# Patient Record
Sex: Male | Born: 1955 | Race: White | Hispanic: No | Marital: Married | Smoking: Never smoker
Health system: Southern US, Community
[De-identification: ages and names within clinical notes are randomized; demographics above are authoritative.]

## PROBLEM LIST (undated history)

## (undated) DIAGNOSIS — E11621 Type 2 diabetes mellitus with foot ulcer: Secondary | ICD-10-CM

## (undated) DIAGNOSIS — I7300 Raynaud's syndrome without gangrene: Secondary | ICD-10-CM

## (undated) DIAGNOSIS — I341 Nonrheumatic mitral (valve) prolapse: Secondary | ICD-10-CM

## (undated) DIAGNOSIS — E1165 Type 2 diabetes mellitus with hyperglycemia: Secondary | ICD-10-CM

## (undated) DIAGNOSIS — K2270 Barrett's esophagus without dysplasia: Secondary | ICD-10-CM

## (undated) DIAGNOSIS — K219 Gastro-esophageal reflux disease without esophagitis: Secondary | ICD-10-CM

## (undated) DIAGNOSIS — E119 Type 2 diabetes mellitus without complications: Secondary | ICD-10-CM

## (undated) DIAGNOSIS — M109 Gout, unspecified: Secondary | ICD-10-CM

## (undated) DIAGNOSIS — I10 Essential (primary) hypertension: Secondary | ICD-10-CM

## (undated) DIAGNOSIS — N1832 Chronic kidney disease, stage 3b: Secondary | ICD-10-CM

## (undated) DIAGNOSIS — Z794 Long term (current) use of insulin: Secondary | ICD-10-CM

## (undated) HISTORY — PX: NO PAST SURGERIES: SHX2092

## (undated) HISTORY — PX: OTHER SURGICAL HISTORY: SHX169

---

## 2010-09-09 DEATH — deceased

## 2013-02-16 ENCOUNTER — Encounter: Payer: Self-pay | Admitting: Internal Medicine

## 2013-03-02 ENCOUNTER — Ambulatory Visit (INDEPENDENT_AMBULATORY_CARE_PROVIDER_SITE_OTHER): Payer: Managed Care, Other (non HMO) | Admitting: Internal Medicine

## 2013-03-02 DIAGNOSIS — Z7189 Other specified counseling: Secondary | ICD-10-CM

## 2013-03-02 DIAGNOSIS — Z23 Encounter for immunization: Secondary | ICD-10-CM

## 2013-03-02 DIAGNOSIS — Z7184 Encounter for health counseling related to travel: Secondary | ICD-10-CM | POA: Insufficient documentation

## 2013-03-02 NOTE — Progress Notes (Signed)
  Subjective:    [name] [name] is a 58 y.o. male who presents to the Infectious Disease clinic for travel consultation. Planned departure date: July 16th          Planned return date: July 31st Countries of travel: [place] Areas in country: travel on a guided tour   Accommodations: hotel Purpose of travel: vacation Prior travel out of [place]: yes Currently ill / Fever: no History of liver or kidney disease: no  Data Review:  Janumet, Nexium, Propecia; has diabetes 2   Review of Systems n/a    Objective:    n/a    Assessment:    No contraindications to travel. none      Plan:    Issues discussed: environmental concerns, freshwater swimming, future shots, insect-borne illnesses, malaria, MVA safety, rabies, safe food/water, traveler's diarrhea, website/handouts for more information, what to do if ill upon return, what to do if ill while there and Yellow Fever. Immunizations recommended: Hepatitis A series, Typhoid (parenteral) and Yellow Fever. Malaria prophylaxis: malarone, daily dose starting 1-2 days before entering endemic area, ending 7 days after leaving area Traveler's diarrhea prophylaxis: ciprofloxacin.

## 2013-08-16 ENCOUNTER — Ambulatory Visit: Payer: Managed Care, Other (non HMO)

## 2013-11-30 ENCOUNTER — Ambulatory Visit (HOSPITAL_COMMUNITY)
Admission: RE | Admit: 2013-11-30 | Discharge: 2013-11-30 | Disposition: A | Discharge status: Home / Self Care | Payer: Managed Care, Other (non HMO) | Source: Ambulatory Visit | Attending: Cardiovascular Disease | Admitting: Cardiovascular Disease

## 2013-11-30 ENCOUNTER — Encounter (HOSPITAL_COMMUNITY): Payer: Managed Care, Other (non HMO)

## 2013-11-30 ENCOUNTER — Other Ambulatory Visit (HOSPITAL_COMMUNITY): Payer: Self-pay | Admitting: *Deleted

## 2013-11-30 DIAGNOSIS — M7989 Other specified soft tissue disorders: Secondary | ICD-10-CM | POA: Diagnosis not present

## 2013-11-30 DIAGNOSIS — S83511A Sprain of anterior cruciate ligament of right knee, initial encounter: Secondary | ICD-10-CM | POA: Insufficient documentation

## 2013-11-30 DIAGNOSIS — X58XXXA Exposure to other specified factors, initial encounter: Secondary | ICD-10-CM | POA: Diagnosis not present

## 2013-11-30 DIAGNOSIS — S86111A Strain of other muscle(s) and tendon(s) of posterior muscle group at lower leg level, right leg, initial encounter: Secondary | ICD-10-CM | POA: Insufficient documentation

## 2013-11-30 DIAGNOSIS — S86111D Strain of other muscle(s) and tendon(s) of posterior muscle group at lower leg level, right leg, subsequent encounter: Secondary | ICD-10-CM

## 2013-11-30 NOTE — Progress Notes (Signed)
Right Lower Ext. Venous Duplex Completed. Negative for DVT or SVT. Janaa Acero, BS, RDMS, RVT  

## 2013-12-03 ENCOUNTER — Telehealth (HOSPITAL_COMMUNITY): Payer: Self-pay | Admitting: *Deleted

## 2018-05-12 ENCOUNTER — Other Ambulatory Visit: Payer: Self-pay | Admitting: Internal Medicine

## 2018-05-12 DIAGNOSIS — Z0000 Encounter for general adult medical examination without abnormal findings: Secondary | ICD-10-CM

## 2018-05-12 DIAGNOSIS — Z87891 Personal history of nicotine dependence: Secondary | ICD-10-CM

## 2019-04-01 ENCOUNTER — Ambulatory Visit: Payer: Managed Care, Other (non HMO) | Attending: Internal Medicine

## 2019-04-01 DIAGNOSIS — Z23 Encounter for immunization: Secondary | ICD-10-CM

## 2019-04-01 NOTE — Progress Notes (Signed)
   Covid-19 Vaccination Clinic  Name:  [name] [name]    MRN: [id number] DOB: 1955-07-11  04/01/2019  Mr. [name] was observed post Covid-19 immunization for 15 minutes without incidence. He was provided with Vaccine Information Sheet and instruction to access the V-Safe system.   Mr. [name] was instructed to call 911 with any severe reactions post vaccine: Marland Kitchen Difficulty breathing  . Swelling of your face and throat  . A fast heartbeat  . A bad rash all over your body  . Dizziness and weakness    Immunizations Administered    Name Date Dose VIS Date Route   Pfizer COVID-19 Vaccine 04/01/2019  2:52 PM 0.3 mL 01/19/2019 Intramuscular   Manufacturer: [employer], [employer]   Lot: [id number]   NDC: [address]-3

## 2019-04-25 ENCOUNTER — Ambulatory Visit: Payer: Managed Care, Other (non HMO) | Attending: Internal Medicine

## 2019-04-25 DIAGNOSIS — Z23 Encounter for immunization: Secondary | ICD-10-CM

## 2019-04-25 NOTE — Progress Notes (Signed)
   Covid-19 Vaccination Clinic  Name:  [name] [name]    MRN: [id number] DOB: 05/09/1955  04/25/2019  [name] [name] was observed post Covid-19 immunization for 15 minutes without incident. He was provided with Vaccine Information Sheet and instruction to access the V-Safe system.   [name] [name] was instructed to call 911 with any severe reactions post vaccine: Marland Kitchen Difficulty breathing  . Swelling of face and throat  . A fast heartbeat  . A bad rash all over body  . Dizziness and weakness   Immunizations Administered    Name Date Dose VIS Date Route   Pfizer COVID-19 Vaccine 04/25/2019  9:25 AM 0.3 mL 01/19/2019 Intramuscular   Manufacturer: [employer], [employer]   Lot: [id number]   NDC: [address]-2

## 2019-10-24 ENCOUNTER — Other Ambulatory Visit: Payer: Self-pay | Admitting: Urology

## 2019-10-24 DIAGNOSIS — R972 Elevated prostate specific antigen [PSA]: Secondary | ICD-10-CM

## 2019-11-16 ENCOUNTER — Ambulatory Visit
Admission: RE | Admit: 2019-11-16 | Discharge: 2019-11-16 | Disposition: A | Discharge status: Home / Self Care | Payer: Managed Care, Other (non HMO) | Source: Ambulatory Visit | Attending: Urology | Admitting: Urology

## 2019-11-16 ENCOUNTER — Other Ambulatory Visit: Payer: Self-pay

## 2019-11-16 DIAGNOSIS — R972 Elevated prostate specific antigen [PSA]: Secondary | ICD-10-CM

## 2019-11-16 IMAGING — MR MR PROSTATE WO/W CM
12 series · 48 of 48 positions shown · IV contrast (multihance)
Comparison: None.

CLINICAL DATA: Elevated PSA.

EXAM:
MR PROSTATE WITHOUT AND WITH CONTRAST
TECHNIQUE: Multiplanar multisequence MRI images were obtained of the pelvis
centered about the prostate. Pre and post contrast images were
obtained.
CONTRAST:  19mL MULTIHANCE GADOBENATE DIMEGLUMINE 529 MG/ML IV SOLN

[Series 3: T2 · coronal · 3.0mm · 0.70mm/px · 1 of 28 slices shown (1 of 3)]
[im 1/28]
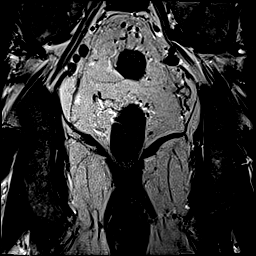

[Series 4: T1 · axial · 5.0mm · 1.25mm/px · z∈[-69,+166]mm · 2 of 96 slices shown]
[im 1/96]
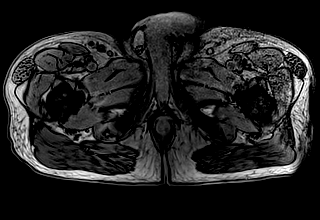
[im 96/96]
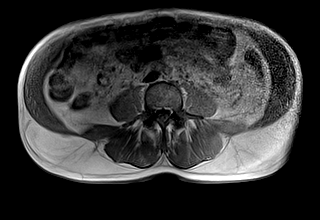

[Series 5: DWI · axial · 3.0mm · 1.75mm/px · z∈[-60,+33]mm · 2 of 96 slices shown (1 of 3)]
[im 1/96]
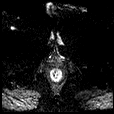
[im 96/96]
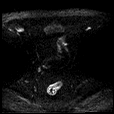

[Series 6: DWI · axial · 3.0mm · 1.75mm/px · 1 of 32 slices shown (2 of 3)]
[im 1/32]
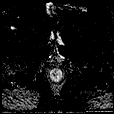

[Series 7: DWI · axial · 3.0mm · 1.75mm/px · 1 of 32 slices shown (3 of 3)]
[im 1/32]
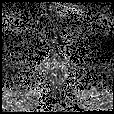

[Series 8: T2 · axial · 3.0mm · 0.56mm/px · 1 of 35 slices shown (2 of 3)]
[im 1/35]
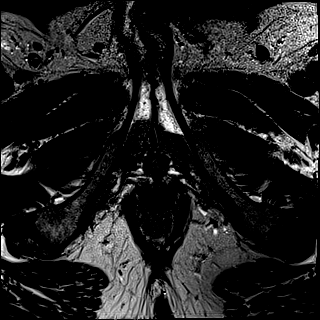

[Series 9: T2 · axial · 1.0mm · 1.04mm/px · z∈[-47,+32]mm · 2 of 80 slices shown (3 of 3)]
[im 1/80]
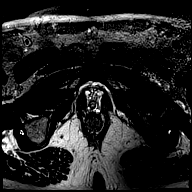
[im 80/80]
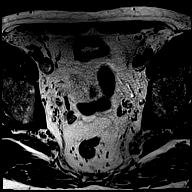

[Series 10: pre t1_twist_tra_dyn · axial · non-contrast · 3.5mm · 0.83mm/px · 1 of 24 slices shown]
[im 1/24]
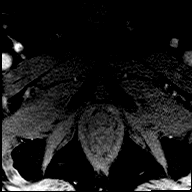

[Series 11: post t1_twist_tra_dyn-copy center · axial · non-contrast · 3.5mm · 0.83mm/px · z∈[-46,+34]mm · 17 of 720 slices shown]
[im 1/720]
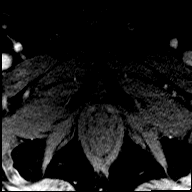
[im 45/720]
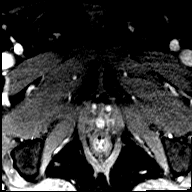
[im 90/720]
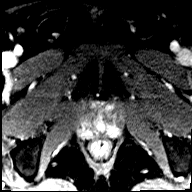
[im 135/720]
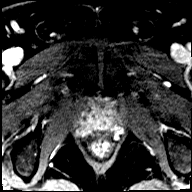
[im 180/720]
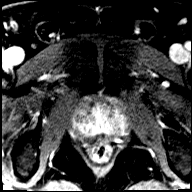
[im 225/720]
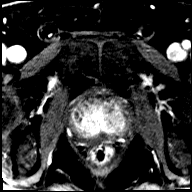
[im 270/720]
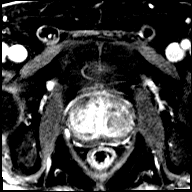
[im 315/720]
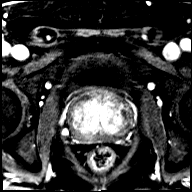
[im 360/720]
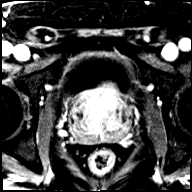
[im 405/720]
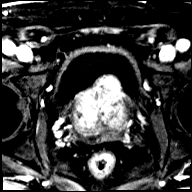
[im 450/720]
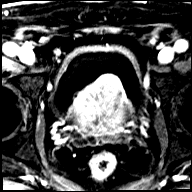
[im 495/720]
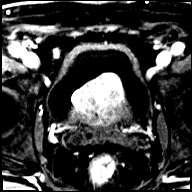
[im 540/720]
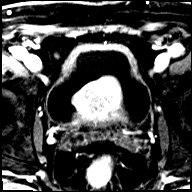
[im 585/720]
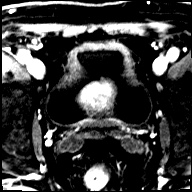
[im 630/720]
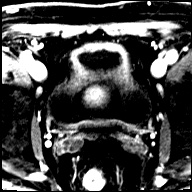
[im 675/720]
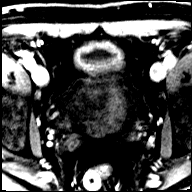
[im 720/720]
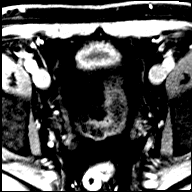

[Series 12: post t1_twist_tra_dyn-copy cent_sub · axial · 3.5mm · 0.83mm/px · z∈[-46,+34]mm · 16 of 683 slices shown]
[im 1/683]
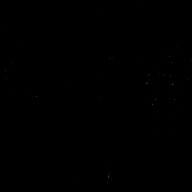
[im 46/683]
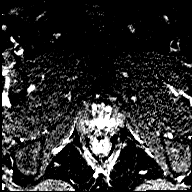
[im 91/683]
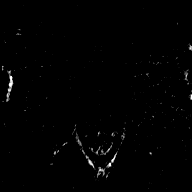
[im 137/683]
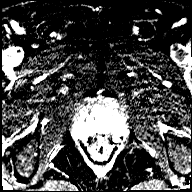
[im 182/683]
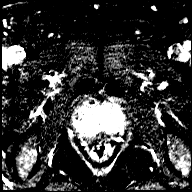
[im 228/683]
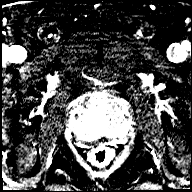
[im 273/683]
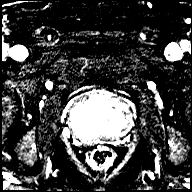
[im 319/683]
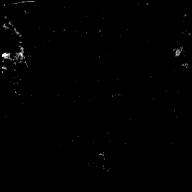
[im 364/683]
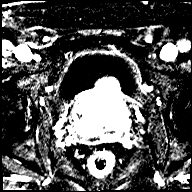
[im 410/683]
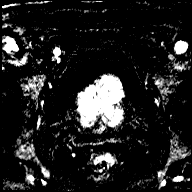
[im 455/683]
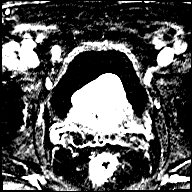
[im 501/683]
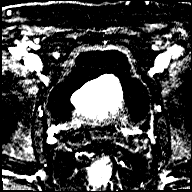
[im 546/683]
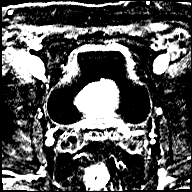
[im 592/683]
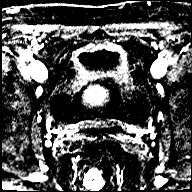
[im 637/683]
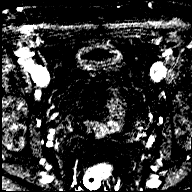
[im 683/683]
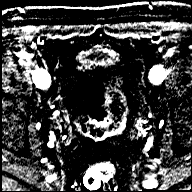

[Series 13: t1_vibe_dixon_tra_f · axial · 2.5mm · 1.14mm/px · z∈[-81,+156]mm · 2 of 96 slices shown]
[im 1/96]
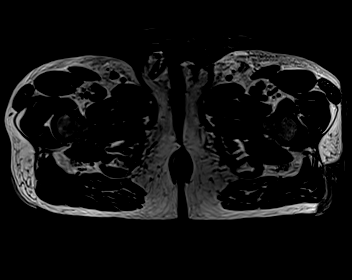
[im 96/96]
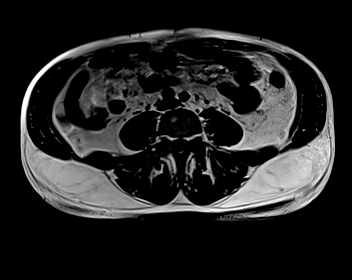

[Series 14: t1_vibe_dixon_tra_w · axial · 2.5mm · 1.14mm/px · z∈[-81,+156]mm · 2 of 96 slices shown]
[im 1/96]
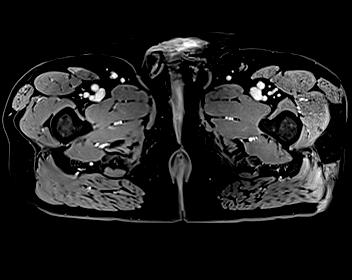
[im 96/96]
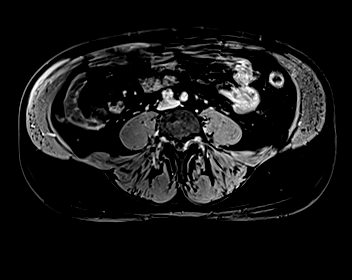

[48 of 48 positions shown; findings below may reference images not displayed]

FINDINGS: Prostate:

-- Peripheral Zone: No abnormality seen on ADC and high b-value DWI
sequences.

-- Transition/Central Zone: Circumscribed BPH nodules are noted, but
no suspicious nodules with obscured or non-circumscribed margins
seen. Marked median lobe hypertrophy is seen which indents the
bladder base.

-- Measurements/Volume:  7.9 by 4.3 x 5.6 cm (volume = 100 cm^3)

Transcapsular spread:  Absent

Seminal vesicle involvement:  Absent

Neurovascular bundle involvement:  Absent

Pelvic adenopathy: None visualized

Bone metastasis: None visualized

Other:  None
IMPRESSION: No radiographic evidence of high-grade prostate carcinoma. PI-RADS
1: Very Low (clinically significant cancer is highly unlikely to be
present)

## 2019-11-16 MED ORDER — GADOBENATE DIMEGLUMINE 529 MG/ML IV SOLN
19.0000 mL | Freq: Once | INTRAVENOUS | Status: AC | PRN
  Administered 2019-11-16: 19 mL via INTRAVENOUS

## 2019-12-19 ENCOUNTER — Ambulatory Visit (HOSPITAL_COMMUNITY)
Admission: RE | Admit: 2019-12-19 | Discharge: 2019-12-19 | Disposition: A | Discharge status: Home / Self Care | Payer: Managed Care, Other (non HMO) | Source: Ambulatory Visit | Attending: Internal Medicine | Admitting: Internal Medicine

## 2019-12-19 ENCOUNTER — Other Ambulatory Visit (HOSPITAL_COMMUNITY): Payer: Self-pay | Admitting: Internal Medicine

## 2019-12-19 ENCOUNTER — Other Ambulatory Visit: Payer: Self-pay

## 2019-12-19 DIAGNOSIS — R609 Edema, unspecified: Secondary | ICD-10-CM | POA: Insufficient documentation

## 2019-12-19 DIAGNOSIS — R52 Pain, unspecified: Secondary | ICD-10-CM | POA: Insufficient documentation

## 2020-09-05 ENCOUNTER — Other Ambulatory Visit: Payer: Self-pay | Admitting: Urology

## 2020-09-05 DIAGNOSIS — R972 Elevated prostate specific antigen [PSA]: Secondary | ICD-10-CM

## 2020-09-22 ENCOUNTER — Ambulatory Visit
Admission: RE | Admit: 2020-09-22 | Discharge: 2020-09-22 | Disposition: A | Discharge status: Home / Self Care | Payer: Managed Care, Other (non HMO) | Source: Ambulatory Visit | Attending: Urology | Admitting: Urology

## 2020-09-22 ENCOUNTER — Other Ambulatory Visit: Payer: Self-pay

## 2020-09-22 DIAGNOSIS — R972 Elevated prostate specific antigen [PSA]: Secondary | ICD-10-CM

## 2020-09-22 IMAGING — MR MR PROSTATE WO/W CM
12 series · 48 of 48 positions shown · IV contrast (multihance)
Comparison: [DATE]

CLINICAL DATA: Elevated PSA.

EXAM:
MR PROSTATE WITHOUT AND WITH CONTRAST
TECHNIQUE: Multiplanar multisequence MRI images were obtained of the pelvis
centered about the prostate. Pre and post contrast images were
obtained.
CONTRAST:  19mL MULTIHANCE GADOBENATE DIMEGLUMINE 529 MG/ML IV SOLN

[Series 4: T2 · coronal · 3.0mm · 0.56mm/px · 1 of 23 slices shown (1 of 3)]
[im 1/23]
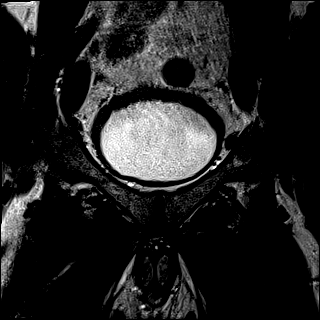

[Series 5: T1 · axial · 5.0mm · 1.25mm/px · 1 of 80 slices shown]
[im 1/80]
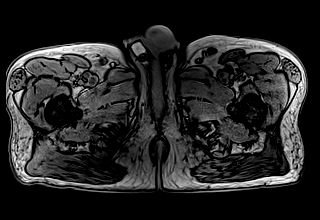

[Series 6: DWI · axial · 3.0mm · 1.75mm/px · z∈[-45,+36]mm · 2 of 84 slices shown (1 of 3)]
[im 1/84]
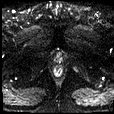
[im 84/84]
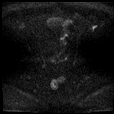

[Series 7: DWI · axial · 3.0mm · 1.75mm/px · 1 of 28 slices shown (2 of 3)]
[im 1/28]
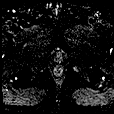

[Series 8: DWI · axial · 3.0mm · 1.75mm/px · 1 of 28 slices shown (3 of 3)]
[im 1/28]
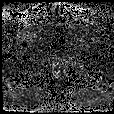

[Series 10: T2 · axial · 3.0mm · 0.56mm/px · 1 of 27 slices shown (2 of 3)]
[im 1/27]
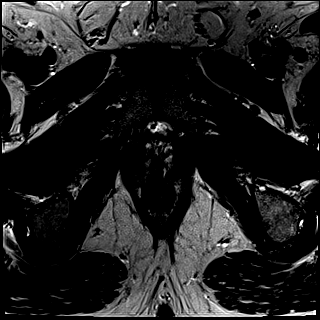

[Series 11: T2 · axial · 1.0mm · 1.04mm/px · z∈[-40,+31]mm · 2 of 72 slices shown (3 of 3)]
[im 1/72]
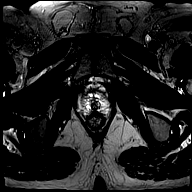
[im 72/72]
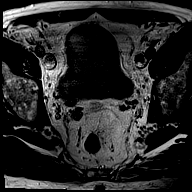

[Series 12: pre t1_twist_tra_dyn · axial · non-contrast · 3.5mm · 0.83mm/px · 1 of 22 slices shown]
[im 1/22]
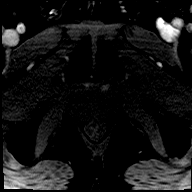

[Series 13: post t1_twist_tra_dyn-copy center · axial · non-contrast · 3.5mm · 0.83mm/px · z∈[-41,+32]mm · 17 of 660 slices shown]
[im 1/660]
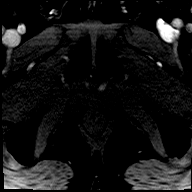
[im 42/660]
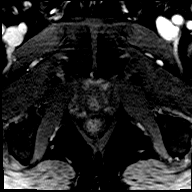
[im 83/660]
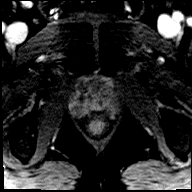
[im 124/660]
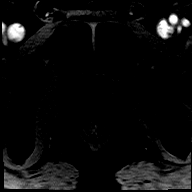
[im 165/660]
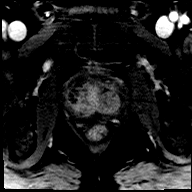
[im 206/660]
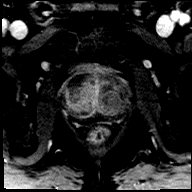
[im 248/660]
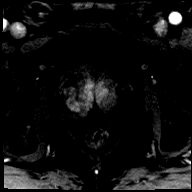
[im 289/660]
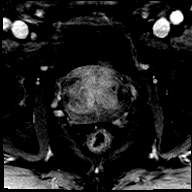
[im 330/660]
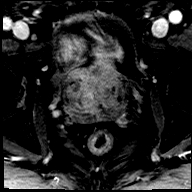
[im 371/660]
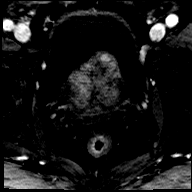
[im 412/660]
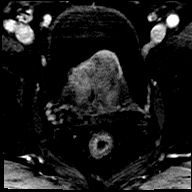
[im 454/660]
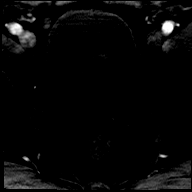
[im 495/660]
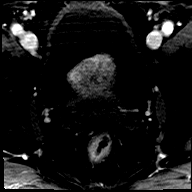
[im 536/660]
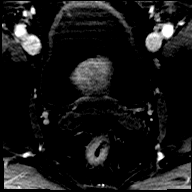
[im 577/660]
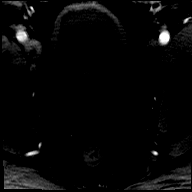
[im 618/660]
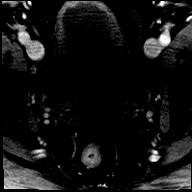
[im 660/660]
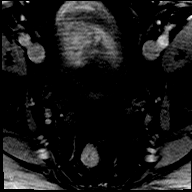

[Series 14: post t1_twist_tra_dyn-copy cent_sub · axial · 3.5mm · 0.83mm/px · z∈[-41,+32]mm · 17 of 638 slices shown]
[im 1/638]
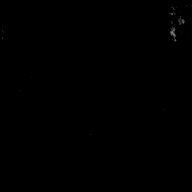
[im 40/638]
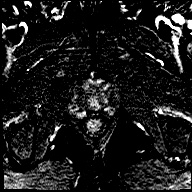
[im 80/638]
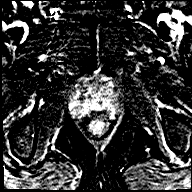
[im 120/638]
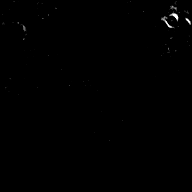
[im 160/638]
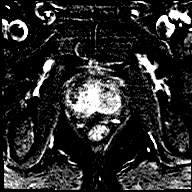
[im 200/638]
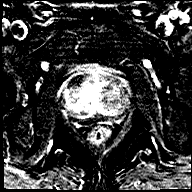
[im 239/638]
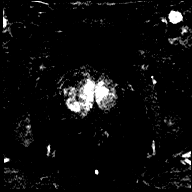
[im 279/638]
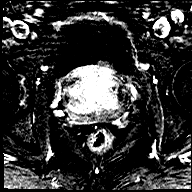
[im 319/638]
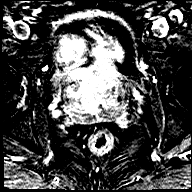
[im 359/638]
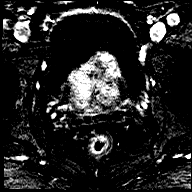
[im 399/638]
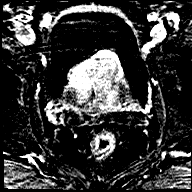
[im 438/638]
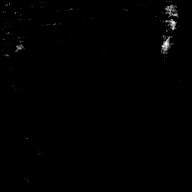
[im 478/638]
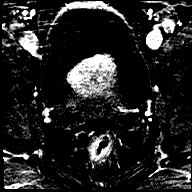
[im 518/638]
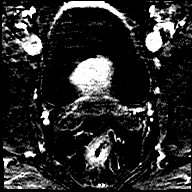
[im 558/638]
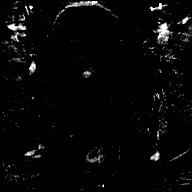
[im 598/638]
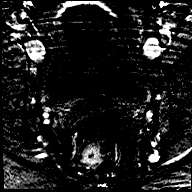
[im 638/638]
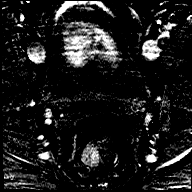

[Series 15: t1_vibe_dixon_tra_f · axial · 2.5mm · 0.91mm/px · z∈[-81,+116]mm · 2 of 80 slices shown]
[im 1/80]
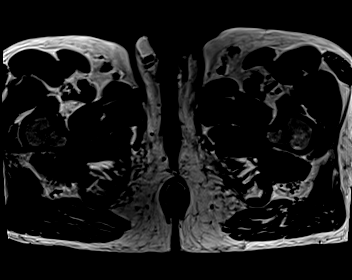
[im 80/80]
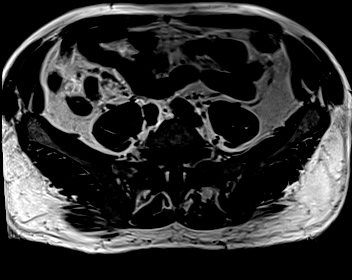

[Series 16: t1_vibe_dixon_tra_w · axial · 2.5mm · 0.91mm/px · z∈[-81,+116]mm · 2 of 80 slices shown]
[im 1/80]
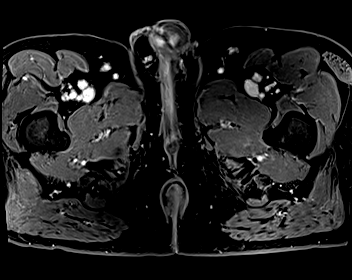
[im 80/80]
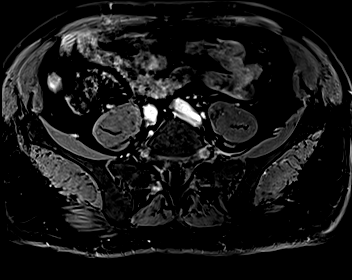

[48 of 48 positions shown; findings below may reference images not displayed]

FINDINGS: Prostate:

-- Peripheral Zone: Diffuse thinning is seen due to central gland
enlargement, however no focal lesion seen on ADC or high b-value DWI
sequences.

-- Transition/Central Zone: Enlarged, especially the median lobe
which protrudes into the urinary bladder. Encapsulated BPH nodules
noted. A dominant 1.5 cm encapsulated heterogeneous T2 hypointense
nodule is again seen in the left posterior mid gland. This shows
moderate ADC hypointensity, but no DWI hyperintensity and is
unchanged since previous study, consistent with a PI-RADS 2 lesion.

-- Measurements/Volume:  7.3 by 4.5 x 5.7 cm (volume = 98 cm^3)

Transcapsular spread:  Absent

Seminal vesicle involvement:  Absent

Neurovascular bundle involvement:  Absent

Pelvic adenopathy: None visualized

Bone metastasis: None visualized

Other:  None
IMPRESSION: No radiographic evidence of high-grade prostate carcinoma. PI-RADS 2
(v.2.1): Low (clinically significant cancer unlikely)

## 2020-09-22 MED ORDER — GADOBENATE DIMEGLUMINE 529 MG/ML IV SOLN
19.0000 mL | Freq: Once | INTRAVENOUS | Status: AC | PRN
  Administered 2020-09-22: 19 mL via INTRAVENOUS

## 2021-01-19 ENCOUNTER — Other Ambulatory Visit: Payer: Self-pay

## 2021-01-19 ENCOUNTER — Encounter (HOSPITAL_BASED_OUTPATIENT_CLINIC_OR_DEPARTMENT_OTHER): Payer: Self-pay | Admitting: Orthopedic Surgery

## 2021-01-19 ENCOUNTER — Other Ambulatory Visit (HOSPITAL_COMMUNITY): Payer: Self-pay | Admitting: Orthopedic Surgery

## 2021-01-20 ENCOUNTER — Other Ambulatory Visit: Payer: Self-pay

## 2021-01-20 ENCOUNTER — Encounter (HOSPITAL_BASED_OUTPATIENT_CLINIC_OR_DEPARTMENT_OTHER): Payer: Self-pay | Admitting: Orthopedic Surgery

## 2021-01-20 ENCOUNTER — Ambulatory Visit (HOSPITAL_BASED_OUTPATIENT_CLINIC_OR_DEPARTMENT_OTHER): Payer: Managed Care, Other (non HMO) | Admitting: Anesthesiology

## 2021-01-20 ENCOUNTER — Encounter (HOSPITAL_BASED_OUTPATIENT_CLINIC_OR_DEPARTMENT_OTHER)
Admission: RE | Disposition: A | Discharge status: Home / Self Care | Payer: Self-pay | Source: Ambulatory Visit | Attending: Orthopedic Surgery

## 2021-01-20 ENCOUNTER — Ambulatory Visit (HOSPITAL_BASED_OUTPATIENT_CLINIC_OR_DEPARTMENT_OTHER)
Admission: RE | Admit: 2021-01-20 | Discharge: 2021-01-20 | Disposition: A | Discharge status: Home / Self Care | Payer: Managed Care, Other (non HMO) | Source: Ambulatory Visit | Attending: Orthopedic Surgery | Admitting: Orthopedic Surgery

## 2021-01-20 DIAGNOSIS — M869 Osteomyelitis, unspecified: Secondary | ICD-10-CM | POA: Insufficient documentation

## 2021-01-20 DIAGNOSIS — Z794 Long term (current) use of insulin: Secondary | ICD-10-CM | POA: Diagnosis not present

## 2021-01-20 DIAGNOSIS — E1122 Type 2 diabetes mellitus with diabetic chronic kidney disease: Secondary | ICD-10-CM | POA: Insufficient documentation

## 2021-01-20 DIAGNOSIS — N189 Chronic kidney disease, unspecified: Secondary | ICD-10-CM | POA: Insufficient documentation

## 2021-01-20 DIAGNOSIS — E11621 Type 2 diabetes mellitus with foot ulcer: Secondary | ICD-10-CM | POA: Insufficient documentation

## 2021-01-20 DIAGNOSIS — E119 Type 2 diabetes mellitus without complications: Secondary | ICD-10-CM

## 2021-01-20 DIAGNOSIS — E1169 Type 2 diabetes mellitus with other specified complication: Secondary | ICD-10-CM | POA: Diagnosis not present

## 2021-01-20 DIAGNOSIS — L97519 Non-pressure chronic ulcer of other part of right foot with unspecified severity: Secondary | ICD-10-CM | POA: Insufficient documentation

## 2021-01-20 HISTORY — DX: Nonrheumatic mitral (valve) prolapse: I34.1

## 2021-01-20 HISTORY — PX: AMPUTATION TOE: SHX6595

## 2021-01-20 HISTORY — DX: Gout, unspecified: M10.9

## 2021-01-20 HISTORY — DX: Type 2 diabetes mellitus without complications: E11.9

## 2021-01-20 HISTORY — DX: Type 2 diabetes mellitus with foot ulcer: E11.621

## 2021-01-20 LAB — GLUCOSE, CAPILLARY
Glucose-Capillary: 126 mg/dL — ABNORMAL HIGH (ref 70–99)
Glucose-Capillary: 116 mg/dL — ABNORMAL HIGH (ref 70–99)

## 2021-01-20 SURGERY — AMPUTATION, TOE
Anesthesia: Monitor Anesthesia Care | Site: Toe | Laterality: Right

## 2021-01-20 MED ORDER — VANCOMYCIN HCL 500 MG IV SOLR
INTRAVENOUS | Status: AC
  Filled 2021-01-20: qty 10

## 2021-01-20 MED ORDER — ACETAMINOPHEN 500 MG PO TABS: 1000.0000 mg | ORAL_TABLET | Freq: Once | ORAL | Status: DC | PRN

## 2021-01-20 MED ORDER — VANCOMYCIN HCL 500 MG IV SOLR
INTRAVENOUS | Status: DC | PRN
  Administered 2021-01-20: 500 mg via TOPICAL

## 2021-01-20 MED ORDER — FENTANYL CITRATE (PF) 100 MCG/2ML IJ SOLN
INTRAMUSCULAR | Status: AC
  Filled 2021-01-20: qty 2

## 2021-01-20 MED ORDER — ONDANSETRON HCL 4 MG/2ML IJ SOLN
INTRAMUSCULAR | Status: AC
  Filled 2021-01-20: qty 2

## 2021-01-20 MED ORDER — BUPIVACAINE-EPINEPHRINE 0.5% -1:200000 IJ SOLN
INTRAMUSCULAR | Status: DC | PRN
  Administered 2021-01-20: 10 mL

## 2021-01-20 MED ORDER — SODIUM CHLORIDE 0.9 % IV SOLN: INTRAVENOUS | Status: DC

## 2021-01-20 MED ORDER — MIDAZOLAM HCL 2 MG/2ML IJ SOLN
INTRAMUSCULAR | Status: AC
  Filled 2021-01-20: qty 2

## 2021-01-20 MED ORDER — FENTANYL CITRATE (PF) 100 MCG/2ML IJ SOLN: 25.0000 ug | INTRAMUSCULAR | Status: DC | PRN

## 2021-01-20 MED ORDER — ACETAMINOPHEN 120 MG RE SUPP
RECTAL | Status: AC
  Filled 2021-01-20: qty 1

## 2021-01-20 MED ORDER — LIDOCAINE HCL 2 % IJ SOLN
INTRAMUSCULAR | Status: AC
  Filled 2021-01-20: qty 20

## 2021-01-20 MED ORDER — OXYCODONE HCL 5 MG/5ML PO SOLN: 5.0000 mg | Freq: Once | ORAL | Status: DC | PRN

## 2021-01-20 MED ORDER — OXYCODONE HCL 5 MG PO TABS: 5.0000 mg | ORAL_TABLET | Freq: Once | ORAL | Status: DC | PRN

## 2021-01-20 MED ORDER — PROPOFOL 500 MG/50ML IV EMUL
INTRAVENOUS | Status: DC | PRN
  Administered 2021-01-20: 75 ug/kg/min via INTRAVENOUS

## 2021-01-20 MED ORDER — CEFAZOLIN SODIUM-DEXTROSE 2-4 GM/100ML-% IV SOLN
INTRAVENOUS | Status: AC
  Filled 2021-01-20: qty 100

## 2021-01-20 MED ORDER — LACTATED RINGERS IV SOLN: INTRAVENOUS | Status: DC

## 2021-01-20 MED ORDER — ACETAMINOPHEN 160 MG/5ML PO SOLN: 1000.0000 mg | Freq: Once | ORAL | Status: DC | PRN

## 2021-01-20 MED ORDER — CEFAZOLIN SODIUM-DEXTROSE 2-4 GM/100ML-% IV SOLN
2.0000 g | INTRAVENOUS | Status: AC
  Administered 2021-01-20: 2 g via INTRAVENOUS

## 2021-01-20 MED ORDER — FENTANYL CITRATE (PF) 100 MCG/2ML IJ SOLN
INTRAMUSCULAR | Status: DC | PRN
  Administered 2021-01-20: 50 ug via INTRAVENOUS

## 2021-01-20 MED ORDER — MIDAZOLAM HCL 2 MG/2ML IJ SOLN
INTRAMUSCULAR | Status: DC | PRN
  Administered 2021-01-20: 2 mg via INTRAVENOUS

## 2021-01-20 MED ORDER — PROPOFOL 10 MG/ML IV BOLUS
INTRAVENOUS | Status: DC | PRN
  Administered 2021-01-20: 30 mg via INTRAVENOUS

## 2021-01-20 MED ORDER — ACETAMINOPHEN 10 MG/ML IV SOLN: 1000.0000 mg | Freq: Once | INTRAVENOUS | Status: DC | PRN

## 2021-01-20 MED ORDER — BUPIVACAINE-EPINEPHRINE (PF) 0.5% -1:200000 IJ SOLN
INTRAMUSCULAR | Status: AC
  Filled 2021-01-20: qty 30

## 2021-01-20 MED ORDER — 0.9 % SODIUM CHLORIDE (POUR BTL) OPTIME
TOPICAL | Status: DC | PRN
  Administered 2021-01-20: 75 mL

## 2021-01-20 MED ORDER — ONDANSETRON HCL 4 MG/2ML IJ SOLN
INTRAMUSCULAR | Status: DC | PRN
  Administered 2021-01-20: 4 mg via INTRAVENOUS

## 2021-01-20 SURGICAL SUPPLY — 62 items
APL PRP STRL LF DISP 70% ISPRP (MISCELLANEOUS) ×1
BLADE AVERAGE 25X9 (BLADE) IMPLANT
BLADE OSC/SAG .038X5.5 CUT EDG (BLADE) ×1 IMPLANT
BLADE SURG 10 STRL SS (BLADE) IMPLANT
BLADE SURG 15 STRL LF DISP TIS (BLADE) ×1 IMPLANT
BLADE SURG 15 STRL SS (BLADE) ×2
BNDG CMPR 9X4 STRL LF SNTH (GAUZE/BANDAGES/DRESSINGS) ×1
BNDG COHESIVE 4X5 TAN ST LF (GAUZE/BANDAGES/DRESSINGS) ×2 IMPLANT
BNDG CONFORM 3 STRL LF (GAUZE/BANDAGES/DRESSINGS) ×1 IMPLANT
BNDG ESMARK 4X9 LF (GAUZE/BANDAGES/DRESSINGS) ×2 IMPLANT
CHLORAPREP W/TINT 26 (MISCELLANEOUS) ×2 IMPLANT
COVER BACK TABLE 60X90IN (DRAPES) ×2 IMPLANT
CUFF TOURN SGL QUICK 24 (TOURNIQUET CUFF)
CUFF TRNQT CYL 24X4X16.5-23 (TOURNIQUET CUFF) IMPLANT
DECANTER SPIKE VIAL GLASS SM (MISCELLANEOUS) IMPLANT
DRAPE EXTREMITY T 121X128X90 (DISPOSABLE) ×2 IMPLANT
DRAPE SURG 17X23 STRL (DRAPES) ×1 IMPLANT
DRAPE U-SHAPE 47X51 STRL (DRAPES) ×2 IMPLANT
DRSG EMULSION OIL 3X3 NADH (GAUZE/BANDAGES/DRESSINGS) IMPLANT
DRSG MEPITEL 4X7.2 (GAUZE/BANDAGES/DRESSINGS) ×2 IMPLANT
DRSG PAD ABDOMINAL 8X10 ST (GAUZE/BANDAGES/DRESSINGS) ×1 IMPLANT
ELECT REM PT RETURN 9FT ADLT (ELECTROSURGICAL) ×2
ELECTRODE REM PT RTRN 9FT ADLT (ELECTROSURGICAL) ×1 IMPLANT
GAUZE SPONGE 4X4 12PLY STRL (GAUZE/BANDAGES/DRESSINGS) ×2 IMPLANT
GLOVE SRG 8 PF TXTR STRL LF DI (GLOVE) ×2 IMPLANT
GLOVE SURG ENC MOIS LTX SZ8 (GLOVE) ×2 IMPLANT
GLOVE SURG LTX SZ8 (GLOVE) ×2 IMPLANT
GLOVE SURG POLYISO LF SZ6.5 (GLOVE) ×1 IMPLANT
GLOVE SURG UNDER POLY LF SZ7 (GLOVE) ×2 IMPLANT
GLOVE SURG UNDER POLY LF SZ8 (GLOVE) ×4
GOWN STRL REUS W/ TWL LRG LVL3 (GOWN DISPOSABLE) ×1 IMPLANT
GOWN STRL REUS W/ TWL XL LVL3 (GOWN DISPOSABLE) ×2 IMPLANT
GOWN STRL REUS W/TWL LRG LVL3 (GOWN DISPOSABLE) ×2
GOWN STRL REUS W/TWL XL LVL3 (GOWN DISPOSABLE) ×4
NDL HYPO 25X1 1.5 SAFETY (NEEDLE) IMPLANT
NDL SAFETY ECLIPSE 18X1.5 (NEEDLE) IMPLANT
NEEDLE HYPO 18GX1.5 SHARP (NEEDLE)
NEEDLE HYPO 25X1 1.5 SAFETY (NEEDLE) ×2 IMPLANT
NS IRRIG 1000ML POUR BTL (IV SOLUTION) ×2 IMPLANT
PACK BASIN DAY SURGERY FS (CUSTOM PROCEDURE TRAY) ×2 IMPLANT
PAD CAST 4YDX4 CTTN HI CHSV (CAST SUPPLIES) ×1 IMPLANT
PADDING CAST COTTON 4X4 STRL (CAST SUPPLIES) ×2
PENCIL SMOKE EVACUATOR (MISCELLANEOUS) ×2 IMPLANT
SANITIZER HAND PURELL 535ML FO (MISCELLANEOUS) ×2 IMPLANT
SHEET MEDIUM DRAPE 40X70 STRL (DRAPES) ×2 IMPLANT
SLEEVE SCD COMPRESS KNEE MED (STOCKING) ×1 IMPLANT
SPONGE T-LAP 18X18 ~~LOC~~+RFID (SPONGE) ×2 IMPLANT
STOCKINETTE 6  STRL (DRAPES) ×2
STOCKINETTE 6 STRL (DRAPES) ×1 IMPLANT
SUCTION FRAZIER HANDLE 10FR (MISCELLANEOUS)
SUCTION TUBE FRAZIER 10FR DISP (MISCELLANEOUS) IMPLANT
SUT ETHILON 2 0 FS 18 (SUTURE) ×2 IMPLANT
SUT ETHILON 2 0 FSLX (SUTURE) IMPLANT
SUT ETHILON 3 0 PS 1 (SUTURE) IMPLANT
SUT MNCRL AB 3-0 PS2 18 (SUTURE) IMPLANT
SWAB COLLECTION DEVICE MRSA (MISCELLANEOUS) IMPLANT
SWAB CULTURE ESWAB REG 1ML (MISCELLANEOUS) IMPLANT
SYR BULB EAR ULCER 3OZ GRN STR (SYRINGE) ×2 IMPLANT
SYR CONTROL 10ML LL (SYRINGE) ×1 IMPLANT
TOWEL GREEN STERILE FF (TOWEL DISPOSABLE) ×2 IMPLANT
TUBE CONNECTING 20X1/4 (TUBING) IMPLANT
UNDERPAD 30X36 HEAVY ABSORB (UNDERPADS AND DIAPERS) ×2 IMPLANT

## 2021-01-20 NOTE — Anesthesia Preprocedure Evaluation (Addendum)
Anesthesia Evaluation  Patient identified by MRN, date of birth, ID band Patient awake    Reviewed: Allergy & Precautions, NPO status , Patient's Chart, lab work & pertinent test results  History of Anesthesia Complications Negative for: history of anesthetic complications  Airway Mallampati: I  TM Distance: >3 FB Neck ROM: Full    Dental  (+) Dental Advisory Given, Teeth Intact   Pulmonary neg pulmonary ROS,    breath sounds clear to auscultation       Cardiovascular negative cardio ROS   Rhythm:Regular     Neuro/Psych negative neurological ROS  negative psych ROS   GI/Hepatic negative GI ROS,   Endo/Other  diabetes, Insulin Dependent  Renal/GU CRFRenal disease     Musculoskeletal Right foot diabetic ulcer   Abdominal   Peds  Hematology negative hematology ROS (+)   Anesthesia Other Findings   Reproductive/Obstetrics                            Anesthesia Physical Anesthesia Plan  ASA: 3  Anesthesia Plan: MAC   Post-op Pain Management:    Induction: Intravenous  PONV Risk Score and Plan: 1 and Propofol infusion and Treatment may vary due to age or medical condition  Airway Management Planned: Nasal Cannula  Additional Equipment: None  Intra-op Plan:   Post-operative Plan:   Informed Consent: I have reviewed the patients History and Physical, chart, labs and discussed the procedure including the risks, benefits and alternatives for the proposed anesthesia with the patient or authorized representative who has indicated his/her understanding and acceptance.     Dental advisory given  Plan Discussed with: CRNA and Anesthesiologist  Anesthesia Plan Comments:         Anesthesia Quick Evaluation

## 2021-01-20 NOTE — Transfer of Care (Signed)
Immediate Anesthesia Transfer of Care Note  Patient: [name] [name]  Procedure(s) Performed: Right hallux amputation through the proximal phalanx (Right: Toe)  Patient Location: PACU  Anesthesia Type:MAC  Level of Consciousness: drowsy, patient cooperative and responds to stimulation  Airway & Oxygen Therapy: Patient Spontanous Breathing and Patient connected to face mask oxygen  Post-op Assessment: Report given to RN and Post -op Vital signs reviewed and stable  Post vital signs: Reviewed and stable  Last Vitals:  Vitals Value Taken Time  BP    Temp    Pulse 75 01/20/21 1700  Resp    SpO2 99 % 01/20/21 1700  Vitals shown include unvalidated device data.  Last Pain:  Vitals:   01/20/21 1247  TempSrc: Oral  PainSc: 0-No pain         Complications: No notable events documented.

## 2021-01-20 NOTE — Discharge Instructions (Addendum)
Toni Arthurs, MD [facility]  Please read the following information regarding your care after surgery.  Medications  All of the medicines listed below are available over the counter. X acetominophen (Tylenol) 650 mg every 4-6 hours as you need for minor to moderate pain  Weight Bearing X Bear weight only on your operated foot in the post-op shoe.  Cast / Splint / Dressing X Keep your splint, cast or dressing clean and dry.  Dont put anything (coat hanger, pencil, etc) down inside of it.  If it gets damp, use a hair dryer on the cool setting to dry it.  If it gets soaked, call the office to schedule an appointment for a cast change.   After your dressing, cast or splint is removed; you may shower, but do not soak or scrub the wound.  Allow the water to run over it, and then gently pat it dry.  Swelling It is normal for you to have swelling where you had surgery.  To reduce swelling and pain, keep your toes above your nose for at least 3 days after surgery.  It may be necessary to keep your foot or leg elevated for several weeks.  If it hurts, it should be elevated.  Follow Up Call my office at [phone] when you are discharged from the hospital or surgery center to schedule an appointment to be seen two weeks after surgery.  Call my office at [phone] if you develop a fever >101.5 F, nausea, vomiting, bleeding from the surgical site or severe pain.     Post Anesthesia Home Care Instructions  Activity: Get plenty of rest for the remainder of the day. A responsible individual must stay with you for 24 hours following the procedure.  For the next 24 hours, DO NOT: -Drive a car -Advertising copywriter -Drink alcoholic beverages -Take any medication unless instructed by your physician -Make any legal decisions or sign important papers.  Meals: Start with liquid foods such as gelatin or soup. Progress to regular foods as tolerated. Avoid greasy, spicy, heavy foods. If nausea and/or  vomiting occur, drink only clear liquids until the nausea and/or vomiting subsides. Call your physician if vomiting continues.  Special Instructions/Symptoms: Your throat may feel dry or sore from the anesthesia or the breathing tube placed in your throat during surgery. If this causes discomfort, gargle with warm salt water. The discomfort should disappear within 24 hours.  If you had a scopolamine patch placed behind your ear for the management of post- operative nausea and/or vomiting:  1. The medication in the patch is effective for 72 hours, after which it should be removed.  Wrap patch in a tissue and discard in the trash. Wash hands thoroughly with soap and water. 2. You may remove the patch earlier than 72 hours if you experience unpleasant side effects which may include dry mouth, dizziness or visual disturbances. 3. Avoid touching the patch. Wash your hands with soap and water after contact with the patch.

## 2021-01-20 NOTE — Op Note (Signed)
01/20/2021  4:59 PM  PATIENT:  [name] [name]  65 y.o. male  PRE-OPERATIVE DIAGNOSIS:  Right foot diabetic ulcer  POST-OPERATIVE DIAGNOSIS:  Right foot diabetic ulcer  Procedure(s):  Right hallux amputation through the IP joint  SURGEON:  Wylene Simmer, MD  ASSISTANT: none  ANESTHESIA:   local, MAC  EBL:  minimal   TOURNIQUET:  approx 10 min with ankle esmarch  COMPLICATIONS:  None apparent  DISPOSITION:  Extubated, awake and stable to recovery.  INDICATION FOR PROCEDURE: The patient is a 65 year old male with a past medical history significant for diabetes.  He developed an ulcer on his right hallux over a month ago.  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  He presents now for right hallux amputation.  The risks and benefits of the alternative treatment options have been discussed in detail.  The patient wishes to proceed with surgery and specifically understands risks of bleeding, infection, nerve damage, blood clots, need for additional surgery, amputation and death.   PROCEDURE IN DETAIL: After preoperative consent was obtained and the correct operative site was identified, the patient was brought the operating room and placed supine on the operating table.  Preoperative antibiotics were administered followed by IV sedation.  Local anesthesia was performed around the first ray with half percent Marcaine with epinephrine.  The right foot was then prepped and draped in standard sterile fashion.  The foot was exsanguinated and a 4 inch Esmarch tourniquet was then wrapped around the ankle.  A fishmouth incision was marked on the skin just distal to the IP joint.  The incision was made and dissection carried down through the subcutaneous tissues.  Subperiosteal dissection was then carried to the level of the IP joint.  The toe was disarticulated through the IP joint and passed off the field.  The remaining proximal soft  tissue appeared healthy with no evidence of necrosis or infection.  The wound was irrigated copiously.  The articular cartilage and subchondral bone were left intact at the proximal phalanx.  Vancomycin powder was sprinkled in the wound after cauterizing the vessels.  The incision was closed with simple and horizontal mattress sutures of 2-0 nylon.  Sterile dressings were applied followed by a compression wrap.  The tourniquet was released after application of the dressings.  The patient was awakened from anesthesia and transported to the recovery room in stable condition.   FOLLOW UP PLAN: Weightbearing as tolerated in a flat postop shoe.  No indication for DVT prophylaxis in this ambulatory patient.  Follow-up in the office in 2 weeks for possible suture removal and wound check.

## 2021-01-20 NOTE — Anesthesia Postprocedure Evaluation (Signed)
Anesthesia Post Note  Patient: [name] [name]  Procedure(s) Performed: Right hallux amputation through the proximal phalanx (Right: Toe)     Patient location during evaluation: PACU Anesthesia Type: MAC Level of consciousness: awake and alert and oriented Pain management: pain level controlled Vital Signs Assessment: post-procedure vital signs reviewed and stable Respiratory status: spontaneous breathing, nonlabored ventilation and respiratory function stable Cardiovascular status: blood pressure returned to baseline Postop Assessment: no apparent nausea or vomiting Anesthetic complications: no   No notable events documented.  Last Vitals:  Vitals:   01/20/21 1700 01/20/21 1715  BP: 124/69 (!) 153/81  Pulse: 75 80  Resp: 16 17  Temp: 36.8 C   SpO2: 99% 97%    Last Pain:  Vitals:   01/20/21 1715  TempSrc:   PainSc: 0-No pain                 Marthenia Rolling

## 2021-01-20 NOTE — H&P (Signed)
[name] [name] is an 65 y.o. male.   Chief Complaint: chronic right hallux diabetic ulcer HPI: 65 y/o male with PMH of diabetes c/o an ulcer of the right hallux for over a month.  He has failed non op treatment including wound care and abx and presents today for right hallux amputation.  Past Medical History:  Diagnosis Date   Diabetes mellitus without complication (HCC)    Gout    MVP (mitral valve prolapse)    Ulcer of foot due to diabetes [facility])     Past Surgical History:  Procedure Laterality Date   NO PAST SURGERIES      History reviewed. No pertinent family history. Social History:  reports that he has never smoked. He has never used smokeless tobacco. He reports that he does not drink alcohol and does not use drugs.  Allergies:  Allergies  Allergen Reactions   Azithromycin Hives    Medications Prior to Admission  Medication Sig Dispense Refill   dapagliflozin propanediol (FARXIGA) 10 MG TABS tablet Take 10 mg by mouth daily.     finasteride (PROSCAR) 5 MG tablet Take 5 mg by mouth daily.     furosemide (LASIX) 40 MG tablet Take 40 mg by mouth.     insulin degludec (TRESIBA FLEXTOUCH) 200 UNIT/ML FlexTouch Pen Inject 24 Units into the skin daily.     tadalafil (CIALIS) 5 MG tablet Take 5 mg by mouth daily.     colchicine 0.6 MG tablet Take 0.6 mg by mouth daily.      Results for orders placed or performed during the hospital encounter of January 25, 2021 (from the past 48 hour(s))  Glucose, capillary     Status: Abnormal   Collection Time: 01/25/2021 12:54 PM  Result Value Ref Range   Glucose-Capillary 126 (H) 70 - 99 mg/dL    Comment: Glucose reference range applies only to samples taken after fasting for at least 8 hours.   No results found.  Review of Systems  no recent f/c/n/v/wt loss.  Blood pressure (!) 163/93, pulse 82, temperature 98 F (36.7 C), temperature source Oral, resp. rate 18, height 6\' 1"  (1.854 m), weight 89.9 kg, SpO2 100 %. Physical Exam wn wd male in  nad.  A and O x 4.  Normal mood and affect.  EOMI.  Resp unlabored.  R halux with with distal ulcer.  Probes to bone.  No cellulitis.  MRI shows osteo of the distal phalanx but no involvement of the IP joint or proximal phalanx.  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  To OR today for right hallux amputation.  The risks and benefits of the alternative treatment options have been discussed in detail.  The patient wishes to proceed with surgery and specifically understands risks of bleeding, infection, nerve damage, blood clots, need for additional surgery, amputation and death.   , MD 01/25/2021, 4:14 PM

## 2021-01-20 NOTE — Anesthesia Procedure Notes (Signed)
Procedure Name: MAC Date/Time: 01/20/2021 4:24 PM Performed by: Glory Buff, CRNA Oxygen Delivery Method: Simple face mask

## 2021-01-22 ENCOUNTER — Encounter (HOSPITAL_BASED_OUTPATIENT_CLINIC_OR_DEPARTMENT_OTHER): Payer: Self-pay | Admitting: Orthopedic Surgery

## 2021-03-25 NOTE — Progress Notes (Addendum)
[name] [name] ([id number]) Visit Report for 03/26/2021 Allergy List Details Patient Name: Date of Service: [name] [name] [place] C. 03/26/2021 9:00 A M Medical Record Number: [id number] Patient Account Number: [id number] Date of Birth/Sex: Treating RN: Jun 12, 1955 (66 y.o. M) Primary Care Dnasia Gauna: [name] Other Clinician: Referring Tyree Fluharty: Treating [name]/Extender: [name] in Treatment: 0 Allergies Active Allergies azithromycin Reaction: hives Allergy Notes Electronic Signature(s) Signed: 03/26/2021 5:28:56 PM By: [name] RN, BSN Previous Signature: 03/25/2021 2:25:06 PM Version By: Karl Bales EMT Entered By: [name] on 03/26/2021 09:18:22 -------------------------------------------------------------------------------- Arrival Information Details Patient Name: Date of Service: [name] [name] [facility] C. 03/26/2021 9:00 A M Medical Record Number: [id number] Patient Account Number: [id number] Date of Birth/Sex: Treating RN: 1955/10/22 (66 y.o. [name] [name] Primary Care [name]: [name] Other Clinician: Referring Marien Manship: Treating Ector Laurel/Extender: [name] in Treatment: 0 Visit Information Patient Arrived: Ambulatory Arrival Time: 09:16 Accompanied By: self Transfer Assistance: None Patient Identification Verified: Yes Secondary Verification Process Completed: Yes Patient Requires Transmission-Based Precautions: No Patient Has Alerts: Yes Patient Alerts: R ABI N/P Electronic Signature(s) Signed: 03/26/2021 5:28:56 PM By: [name] RN, BSN Entered By: [name] on 03/26/2021 10:33:27 -------------------------------------------------------------------------------- Clinic Level of Care Assessment Details Patient Name: Date of Service: [address] [place] C. 03/26/2021 9:00 A M Medical Record Number: [id number] Patient Account Number: [id number] Date of Birth/Sex: Treating RN: Jun 23, 1955 (66  y.o. [name] [name] Primary Care [name]: [name] Other Clinician: Referring [name]: Treating [name]/Extender: [name] in Treatment: 0 Clinic Level of Care Assessment Items TOOL 1 Quantity Score []  - 0 Use when EandM and Procedure is performed on INITIAL visit ASSESSMENTS - Nursing Assessment / Reassessment X- 1 20 General Physical Exam (combine w/ comprehensive assessment (listed just below) when performed on new pt. evals) X- 1 25 Comprehensive Assessment (HX, ROS, Risk Assessments, Wounds Hx, etc.) ASSESSMENTS - Wound and Skin Assessment / Reassessment []  - 0 Dermatologic / Skin Assessment (not related to wound area) ASSESSMENTS - Ostomy and/or Continence Assessment and Care []  - 0 Incontinence Assessment and Management []  - 0 Ostomy Care Assessment and Management (repouching, etc.) PROCESS - Coordination of Care X - Simple Patient / Family Education for ongoing care 1 15 []  - 0 Complex (extensive) Patient / Family Education for ongoing care X- 1 10 Staff obtains Chiropractor, Records, T Results / Process Orders est []  - 0 Staff telephones HHA, Nursing Homes / Clarify orders / etc []  - 0 Routine Transfer to another Facility (non-emergent condition) []  - 0 Routine Hospital Admission (non-emergent condition) X- 1 15 New Admissions / Manufacturing engineer / Ordering NPWT Apligraf, etc. , []  - 0 Emergency Hospital Admission (emergent condition) PROCESS - Special Needs []  - 0 Pediatric / Minor Patient Management []  - 0 Isolation Patient Management []  - 0 Hearing / Language / Visual special needs []  - 0 Assessment of Community assistance (transportation, D/C planning, etc.) []  - 0 Additional assistance / Altered mentation []  - 0 Support Surface(s) Assessment (bed, cushion, seat, etc.) INTERVENTIONS - Miscellaneous []  - 0 External ear exam []  - 0 Patient Transfer (multiple staff / Nurse, adult / Similar devices) []  -  0 Simple Staple / Suture removal (25 or less) []  - 0 Complex Staple / Suture removal (26 or more) []  - 0 Hypo/Hyperglycemic Management (do not check if billed separately) X- 1 15 Ankle / Brachial Index (ABI) - do not check if billed separately Has  the patient been seen at the hospital within the last three years: Yes Total Score: 100 Level Of Care: New/Established - Level 3 Electronic Signature(s) Signed: 03/26/2021 5:28:56 PM By: Zenaida DeedBoehlein, Linda RN, BSN Entered By: Zenaida DeedBoehlein, Linda on 03/26/2021 10:05:09 -------------------------------------------------------------------------------- Encounter Discharge Information Details Patient Name: [place] [address] [name]. 03/26/2021 9:00 A M Medical Record Number: [id number] Patient Account Number: [id number] Date of Birth/Sex: Treating RN: 11/10/1955 (66 y.o. [name] [name]) Boehlein, Linda Primary Care [name]: Martha ClanShaw, William Other Clinician: Referring [name]: Treating [name]/Extender: Mikki Santeeobson, [name], [name] in Treatment: 0 Encounter Discharge Information Items Post Procedure Vitals Discharge Condition: Stable Temperature (F): 98 Ambulatory Status: Ambulatory Pulse (bpm): 80 Discharge Destination: Home Respiratory Rate (breaths/min): 18 Transportation: Private Auto Blood Pressure (mmHg): 197/93 Accompanied By: self Schedule Follow-up Appointment: Yes Clinical Summary of Care: Patient Declined Electronic Signature(s) Signed: 03/26/2021 5:28:56 PM By: Zenaida DeedBoehlein, Linda RN, BSN Entered By: Zenaida DeedBoehlein, Linda on 03/26/2021 10:26:20 -------------------------------------------------------------------------------- Lower Extremity Assessment Details Patient Name: Date of Service[address] [name] 03/26/2021 9:00 A M Medical Record Number: [id number] Patient Account Number: [id number] Date of Birth/Sex: Treating RN: 07/23/1955 (66 y.o. [name] [name]) Boehlein, Linda Primary Care Vincenzina Jagoda: Martha ClanShaw, William Other Clinician: Referring  Lenia Housley: Treating Chere Babson/Extender: Mikki Santeeobson, [name], [name] in Treatment: 0 Edema Assessment Assessed: Kyra Searles[Left: No] Franne Forts[Right: No] Edema: [Left: N] [Right: o] Calf Left: Right: Point of Measurement: From Medial Instep 41 cm Ankle Left: Right: Point of Measurement: From Medial Instep 2405 cm Vascular Assessment Pulses: Dorsalis Pedis Palpable: [Right:Yes] Notes right DP and PT pulses noncompressible Electronic Signature(s) Signed: 03/26/2021 5:28:56 PM By: Zenaida DeedBoehlein, Linda RN, BSN Entered By: Zenaida DeedBoehlein, Linda on 03/26/2021 09:38:18 -------------------------------------------------------------------------------- Multi Wound Chart Details Patient Name: Date of Service: [name], [name] C. 03/26/2021 9:00 A M Medical Record Number: [id number] Patient Account Number: [id number] Date of Birth/Sex: Treating RN: 12/17/1955 (66 y.o. M) Primary Care Equan Cogbill: Martha ClanShaw, William Other Clinician: Referring Tammatha Cobb: Treating Jabez Molner/Extender: Mikki Santeeobson, [name], [name] in Treatment: 0 Vital Signs Height(in): 73 Capillary Blood Glucose(mg/dl): 629163 Weight(lbs): 528203 Pulse(bpm): 80 Body Mass Index(BMI): 26.8 Blood Pressure(mmHg): 197/93 Temperature(F): 98 Respiratory Rate(breaths/min): 18 Photos: [N/A:N/A] Right, Plantar T Great oe N/A N/A Wound Location: Gradually Appeared N/A N/A Wounding Event: Diabetic Wound/Ulcer of the Lower N/A N/A Primary Etiology: Extremity Hypertension, Type II Diabetes, Gout, N/A N/A Comorbid History: Neuropathy 03/05/2021 N/A N/A Date Acquired: 0 N/A N/A Weeks of Treatment: Open N/A N/A Wound Status: No N/A N/A Wound Recurrence: 0.7x0.7x0.1 N/A N/A Measurements L x W x D (cm) 0.385 N/A N/A A (cm) : rea 0.038 N/A N/A Volume (cm) : 0.00% N/A N/A % Reduction in A rea: 0.00% N/A N/A % Reduction in Volume: 12 Starting Position 1 (o'clock): 8 Ending Position 1 (o'clock): 0.3 Maximum Distance 1 (cm): Yes N/A  N/A Undermining: Grade 1 N/A N/A Classification: Medium N/A N/A Exudate A mount: Serosanguineous N/A N/A Exudate Type: red, brown N/A N/A Exudate Color: Thickened N/A N/A Wound Margin: Large (67-100%) N/A N/A Granulation A mount: Red N/A N/A Granulation Quality: None Present (0%) N/A N/A Necrotic A mount: Fat Layer (Subcutaneous Tissue): Yes N/A N/A Exposed Structures: Fascia: No Tendon: No Muscle: No Joint: No Bone: No None N/A N/A Epithelialization: Debridement - Excisional N/A N/A Debridement: Pre-procedure Verification/Time Out 10:00 N/A N/A Taken: Lidocaine 4% Topical Solution N/A N/A Pain Control: Callus, Subcutaneous N/A N/A Tissue Debrided: Skin/Subcutaneous Tissue N/A N/A Level: 0.81 N/A N/A Debridement A (sq cm): rea Curette N/A N/A Instrument: Minimum N/A  N/A Bleeding: Pressure N/A N/A Hemostasis A chieved: 0 N/A N/A Procedural Pain: 0 N/A N/A Post Procedural Pain: Procedure was tolerated well N/A N/A Debridement Treatment Response: 0.9x0.9x0.1 N/A N/A Post Debridement Measurements L x W x D (cm) 0.064 N/A N/A Post Debridement Volume: (cm) Debridement N/A N/A Procedures Performed: Treatment Notes Electronic Signature(s) Signed: 03/26/2021 5:55:17 PM By: Baltazar Najjar MD Entered By: Baltazar Najjar on 03/26/2021 10:14:02 -------------------------------------------------------------------------------- Multi-Disciplinary Care Plan Details Patient Name: Date of Service: [name] [name] HN C. 03/26/2021 9:00 A M Medical Record Number: [id number] Patient Account Number: [id number] Date of Birth/Sex: Treating RN: [birth year] (66 y.o. [name] [name] Primary Care Allessandra Bernardi: [name] Other Clinician: Referring Enedelia Martorelli: Treating Navi Erber/Extender: [name] in Treatment: 0 Multidisciplinary Care Plan reviewed with physician Active Inactive Nutrition Nursing Diagnoses: Impaired glucose control: actual or  potential Potential for alteratiion in Nutrition/Potential for imbalanced nutrition Goals: Patient/caregiver will maintain therapeutic glucose control Date Initiated: 03/26/2021 Target Resolution Date: 04/23/2021 Goal Status: Active Interventions: Assess patient nutrition upon admission and as needed per policy Provide education on elevated blood sugars and impact on wound healing Treatment Activities: Patient referred to Primary Care Physician for further nutritional evaluation : 03/26/2021 Notes: Wound/Skin Impairment Nursing Diagnoses: Impaired tissue integrity Knowledge deficit related to ulceration/compromised skin integrity Goals: Patient/caregiver will verbalize understanding of skin care regimen Date Initiated: 03/26/2021 Target Resolution Date: 04/23/2021 Goal Status: Active Ulcer/skin breakdown will have a volume reduction of 30% by week 4 Date Initiated: 03/26/2021 Target Resolution Date: 04/23/2021 Goal Status: Active Interventions: Assess patient/caregiver ability to obtain necessary supplies Assess patient/caregiver ability to perform ulcer/skin care regimen upon admission and as needed Assess ulceration(s) every visit Provide education on ulcer and skin care Treatment Activities: Skin care regimen initiated : 03/26/2021 Topical wound management initiated : 03/26/2021 Notes: Electronic Signature(s) Signed: 03/26/2021 5:28:56 PM By: [name] RN, BSN Entered By: [name] on 03/26/2021 10:03:31 -------------------------------------------------------------------------------- Pain Assessment Details Patient Name: Date of Service: [name] [name] HN C. 03/26/2021 9:00 A M Medical Record Number: [id number] Patient Account Number: [id number] Date of Birth/Sex: Treating RN: 1956-01-24 (66 y.o. [name] [name] Primary Care Waynesha Rammel: [name] Other Clinician: Referring Shanda Cadotte: Treating Quintyn Dombek/Extender: [name] in Treatment:  0 Active Problems Location of Pain Severity and Description of Pain Patient Has Paino No Site Locations Rate the pain. Current Pain Level: 0 Pain Management and Medication Current Pain Management: Electronic Signature(s) Signed: 03/26/2021 5:28:56 PM By: [name] RN, BSN Entered By: [name] on 03/26/2021 09:44:01 -------------------------------------------------------------------------------- Patient/Caregiver Education Details Patient Name: Date of Service: Deretha Emory 2/16/2023andnbsp9:00 A M Medical Record Number: [id number] Patient Account Number: [id number] Date of Birth/Gender: Treating RN: 1955-07-16 (66 y.o. [name] [name] Primary Care Physician: [name] Other Clinician: Referring Physician: Treating Physician/Extender: [name] in Treatment: 0 Education Assessment Education Provided To: Patient Education Topics Provided Elevated Blood Sugar/ Impact on Healing: Handouts: Elevated Blood Sugars: How Do They Affect Wound Healing Methods: Explain/Verbal, Printed Responses: Reinforcements needed, State content correctly Offloading: Handouts: What is Offloadingo Methods: Explain/Verbal, Printed Responses: Reinforcements needed, State content correctly Wound/Skin Impairment: Handouts: Caring for Your Ulcer, Skin Care Do's and Dont's Methods: Explain/Verbal, Printed Responses: Reinforcements needed, State content correctly Electronic Signature(s) Signed: 03/26/2021 5:28:56 PM By: [name] RN, BSN Entered By: [name] on 03/26/2021 10:04:30 -------------------------------------------------------------------------------- Wound Assessment Details Patient Name: Date of Service: [name] [name] HN C. 03/26/2021 9:00 A M Medical Record Number:  [id number] Patient Account Number: [id number] Date of Birth/Sex: Treating RN: [birth year] (66 y.o. [name] [name] Primary Care Chanetta Moosman: [name] Other  Clinician: Referring Morene Cecilio: Treating Tyrik Stetzer/Extender: [name] in Treatment: 0 Wound Status Wound Number: 1 Primary Etiology: Diabetic Wound/Ulcer of the Lower Extremity Wound Location: Right, Plantar T Great oe Wound Status: Open Wounding Event: Gradually Appeared Comorbid History: Hypertension, Type II Diabetes, Gout, Neuropathy Date Acquired: 03/05/2021 Weeks Of Treatment: 0 Clustered Wound: No Photos Wound Measurements Length: (cm) 0.7 Width: (cm) 0.7 Depth: (cm) 0.1 Area: (cm) 0.385 Volume: (cm) 0.038 % Reduction in Area: 0% % Reduction in Volume: 0% Epithelialization: None Tunneling: No Undermining: Yes Starting Position (o'clock): 12 Ending Position (o'clock): 8 Maximum Distance: (cm) 0.3 Wound Description Classification: Grade 1 Wound Margin: Thickened Exudate Amount: Medium Exudate Type: Serosanguineous Exudate Color: red, brown Foul Odor After Cleansing: No Slough/Fibrino No Wound Bed Granulation Amount: Large (67-100%) Exposed Structure Granulation Quality: Red Fascia Exposed: No Necrotic Amount: None Present (0%) Fat Layer (Subcutaneous Tissue) Exposed: Yes Tendon Exposed: No Muscle Exposed: No Joint Exposed: No Bone Exposed: No Treatment Notes Wound #1 (Toe Great) Wound Laterality: Plantar, Right Cleanser Peri-Wound Care Topical Primary Dressing Promogran Prisma Matrix, 4.34 (sq in) (silver collagen) Discharge Instruction: Moisten collagen with saline or hydrogel Secondary Dressing Woven Gauze Sponges 2x2 in Discharge Instruction: Apply over primary dressing as directed. Optifoam Non-Adhesive Dressing, 4x4 in Discharge Instruction: Apply over primary dressing as directed. Secured With Conforming Stretch Gauze Bandage, Sterile 2x75 (in/in) Discharge Instruction: Secure with stretch gauze as directed. 40M Medipore Soft Cloth Surgical T 2x10 (in/yd) ape Discharge Instruction: Secure with tape as  directed. Compression Wrap Compression Stockings Add-Ons Electronic Signature(s) Signed: 03/26/2021 5:23:57 PM By: Redmond Pulling RN, BSN Signed: 03/26/2021 5:28:56 PM By: [name] RN, BSN Entered By: Redmond Pulling on 03/26/2021 09:53:17 -------------------------------------------------------------------------------- Vitals Details Patient Name: Date of Service: [name] [name] HN C. 03/26/2021 9:00 A M Medical Record Number: [id number] Patient Account Number: [id number] Date of Birth/Sex: Treating RN: Jun 12, 1955 (66 y.o. [name] [name] Primary Care Parth Mccormac: [name] Other Clinician: Referring Cathryn Gallery: Treating Jermie Hippe/Extender: [name] in Treatment: 0 Vital Signs Time Taken: 09:16 Temperature (F): 98 Height (in): 73 Pulse (bpm): 80 Source: Stated Respiratory Rate (breaths/min): 18 Weight (lbs): 203 Blood Pressure (mmHg): 197/93 Source: Stated Capillary Blood Glucose (mg/dl): 008 Body Mass Index (BMI): 26.8 Reference Range: 80 - 120 mg / dl Notes glucose per pt report yesterday morning Electronic Signature(s) Signed: 03/26/2021 5:28:56 PM By: [name] RN, BSN Entered By: [name] on 03/26/2021 09:18:11

## 2021-03-26 ENCOUNTER — Encounter (HOSPITAL_BASED_OUTPATIENT_CLINIC_OR_DEPARTMENT_OTHER): Payer: Managed Care, Other (non HMO) | Attending: Internal Medicine | Admitting: Internal Medicine

## 2021-03-26 ENCOUNTER — Other Ambulatory Visit: Payer: Self-pay

## 2021-03-26 DIAGNOSIS — N183 Chronic kidney disease, stage 3 unspecified: Secondary | ICD-10-CM | POA: Diagnosis not present

## 2021-03-26 DIAGNOSIS — L97518 Non-pressure chronic ulcer of other part of right foot with other specified severity: Secondary | ICD-10-CM | POA: Diagnosis not present

## 2021-03-26 DIAGNOSIS — E11621 Type 2 diabetes mellitus with foot ulcer: Secondary | ICD-10-CM | POA: Diagnosis not present

## 2021-03-26 DIAGNOSIS — E1142 Type 2 diabetes mellitus with diabetic polyneuropathy: Secondary | ICD-10-CM | POA: Diagnosis not present

## 2021-03-26 DIAGNOSIS — E1122 Type 2 diabetes mellitus with diabetic chronic kidney disease: Secondary | ICD-10-CM | POA: Insufficient documentation

## 2021-03-26 DIAGNOSIS — I129 Hypertensive chronic kidney disease with stage 1 through stage 4 chronic kidney disease, or unspecified chronic kidney disease: Secondary | ICD-10-CM | POA: Diagnosis not present

## 2021-03-26 NOTE — Progress Notes (Signed)
SYLVIN, RICHCREEK ([id number]) Visit Report for 03/26/2021 Chief Complaint Document Details Patient Name: Date of Service: [name] [name] [place] C. 03/26/2021 9:00 A M Medical Record Number: [id number] Patient Account Number: [id number] Date of Birth/Sex: Treating RN: 1955-06-18 (66 y.o. M) Primary Care Provider: Marton Redwood Other Clinician: Referring Provider: Treating Provider/Extender: Estelle Grumbles in Treatment: 0 Information Obtained from: Patient Chief Complaint 03/26/2021; patient is here for review of a wound on his plantar right first toe Electronic Signature(s) Signed: 03/26/2021 5:55:17 PM By: Linton Ham MD Entered By: Linton Ham on 03/26/2021 10:15:04 -------------------------------------------------------------------------------- Debridement Details Patient Name: Date of Service: [name] [name] [facility] C. 03/26/2021 9:00 A M Medical Record Number: [id number] Patient Account Number: [id number] Date of Birth/Sex: Treating RN: Sep 23, 1955 (66 y.o. M) Primary Care Provider: Marton Redwood Other Clinician: Referring Provider: Treating Provider/Extender: Estelle Grumbles in Treatment: 0 Debridement Performed for Assessment: Wound #1 Right,Plantar T Great oe Performed By: Physician [name]., MD Debridement Type: Debridement Severity of Tissue Pre Debridement: Fat layer exposed Level of Consciousness (Pre-procedure): Awake and Alert Pre-procedure Verification/Time Out Yes - 10:00 Taken: Start Time: 10:01 Pain Control: Lidocaine 4% T opical Solution T Area Debrided (L x W): otal 0.9 (cm) x 0.9 (cm) = 0.81 (cm) Tissue and other material debrided: Viable, Non-Viable, Callus, Subcutaneous, Skin: Epidermis Level: Skin/Subcutaneous Tissue Debridement Description: Excisional Instrument: Curette Bleeding: Minimum Hemostasis Achieved: Pressure Procedural Pain: 0 Post Procedural Pain: 0 Response to Treatment: Procedure was tolerated  well Level of Consciousness (Post- Awake and Alert procedure): Post Debridement Measurements of Total Wound Length: (cm) 0.9 Width: (cm) 0.9 Depth: (cm) 0.1 Volume: (cm) 0.064 Character of Wound/Ulcer Post Debridement: Improved Severity of Tissue Post Debridement: Fat layer exposed Post Procedure Diagnosis Same as Pre-procedure Electronic Signature(s) Signed: 03/26/2021 5:55:17 PM By: Linton Ham MD Entered By: Linton Ham on 03/26/2021 10:14:32 -------------------------------------------------------------------------------- HPI Details Patient Name: Date of Service: [name] [name] HN C. 03/26/2021 9:00 A M Medical Record Number: [id number] Patient Account Number: [id number] Date of Birth/Sex: Treating RN: [birth year] (66 y.o. M) Primary Care Provider: Marton Redwood Other Clinician: Referring Provider: Treating Provider/Extender: Estelle Grumbles in Treatment: 0 History of Present Illness HPI Description: ADMISSION 03/26/2021 This is a [age]-year-old man with type 2 diabetes and peripheral polyneuropathy. He noticed a wound on his distal phalanx of his right great toe several months ago. This did not make any progress after several visits to see Dr. 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 This is healed nicely however he developed a subsequent blister on the proximal phalanx remanent and has developed an open wound here and he is here for that reason. He has been using Santyl. Our intake nurse noted slight undermining. Dr. Doran Durand gave him a Darco forefoot offloading shoe although he could not wear this. He is now back into usual foot wear at work and he was wearing crocs although he figured this did not do the wound any good. Past medical history includes type 2 diabetes with peripheral neuropathy, Barrett's esophagus, stage III chronic renal failure, gout he takes Lasix as needed ABI in our clinic clinic  was noncompressible Electronic Signature(s) Signed: 03/26/2021 5:55:17 PM By: Linton Ham MD Entered By: Linton Ham on 03/26/2021 10:16:56 -------------------------------------------------------------------------------- Physical Exam Details Patient Name: Date of Service: [name] [name] HN C. 03/26/2021 9:00 A M Medical Record Number: [id number] Patient Account Number: [id number] Date of  Birth/Sex: Treating RN: 10-28-1955 (66 y.o. M) Primary Care Provider: Marton Redwood Other Clinician: Referring Provider: Treating Provider/Extender: Estelle Grumbles in Treatment: 0 Constitutional Patient is hypertensive.. Pulse regular and within target range for patient.Marland Kitchen Respirations regular, non-labored and within target range.. Temperature is normal and within the target range for the patient.Marland Kitchen Appears in no distress. Cardiovascular Dorsalis pedis and posterior tibial pulse on the right is easily palpable. Notes Wound exam; over the remaining proximal phalanx of the right great toe plantar aspect. Small circular wound some undermining is noted on the lateral aspect. Surface of the wound is fairly gritty. I used a #3 curette to remove the undermining area as well as a gritty surface on his toe hemostasis with direct pressure. No evidence of surrounding infection Electronic Signature(s) Signed: 03/26/2021 5:55:17 PM By: Linton Ham MD Entered By: Linton Ham on 03/26/2021 10:18:00 -------------------------------------------------------------------------------- Physician Orders Details Patient Name: Date of Service: [address] HN C. 03/26/2021 9:00 A M Medical Record Number: [id number] Patient Account Number: [id number] Date of Birth/Sex: Treating RN: 07-02-1955 (66 y.o. [name] Primary Care Provider: Marton Redwood Other Clinician: Referring Provider: Treating Provider/Extender: Estelle Grumbles in Treatment: 0 Verbal / Phone Orders:  No Diagnosis Coding Follow-up Appointments ppointment in 1 week. - [name] room 1 Return A Bathing/ Shower/ Hygiene May shower with protection but do not get wound dressing(s) wet. Off-Loading Wound #1 Right,Plantar T Great oe Open toe surgical shoe to: - right foot Wound Treatment Wound #1 - T Great oe Wound Laterality: Plantar, Right Prim Dressing: Promogran Prisma Matrix, 4.34 (sq in) (silver collagen) (Dispense As Written) Every Other Day/30 Days ary Discharge Instructions: Moisten collagen with saline or hydrogel Secondary Dressing: Woven Gauze Sponges 2x2 in Every Other Day/30 Days Discharge Instructions: Apply over primary dressing as directed. Secondary Dressing: Optifoam Non-Adhesive Dressing, 4x4 in (Generic) Every Other Day/30 Days Discharge Instructions: Apply over primary dressing as directed. Secured With: Child psychotherapist, Sterile 2x75 (in/in) (Generic) Every Other Day/30 Days Discharge Instructions: Secure with stretch gauze as directed. Secured With: 47M Medipore Public affairs consultant Surgical T 2x10 (in/yd) Every Other Day/30 Days ape Discharge Instructions: Secure with tape as directed. Electronic Signature(s) Signed: 03/26/2021 5:28:56 PM By: Baruch Gouty RN, BSN Signed: 03/26/2021 5:55:17 PM By: Linton Ham MD Entered By: Baruch Gouty on 03/26/2021 10:13:03 -------------------------------------------------------------------------------- Problem List Details Patient Name: Date of Service: [name] [name] [place] C. 03/26/2021 9:00 A M Medical Record Number: [id number] Patient Account Number: [id number] Date of Birth/Sex: Treating RN: 08-15-1955 (66 y.o. M) Primary Care Provider: Marton Redwood Other Clinician: Referring Provider: Treating Provider/Extender: Estelle Grumbles in Treatment: 0 Active Problems ICD-10 Encounter Code Description Active Date MDM Diagnosis E11.621 Type 2 diabetes mellitus with foot ulcer 03/26/2021 No  Yes L97.518 Non-pressure chronic ulcer of other part of right foot with other specified 03/26/2021 No Yes severity E11.42 Type 2 diabetes mellitus with diabetic polyneuropathy 03/26/2021 No Yes Inactive Problems Resolved Problems Electronic Signature(s) Signed: 03/26/2021 5:55:17 PM By: Linton Ham MD Entered By: Linton Ham on 03/26/2021 10:13:31 -------------------------------------------------------------------------------- Progress Note Details Patient Name: Date of Service: [name] [name] [facility] C. 03/26/2021 9:00 A M Medical Record Number: [id number] Patient Account Number: [id number] Date of Birth/Sex: Treating RN: 04-03-1955 (66 y.o. M) Primary Care Provider: Marton Redwood Other Clinician: Referring Provider: Treating Provider/Extender: Estelle Grumbles in Treatment: 0 Subjective Chief Complaint Information obtained from Patient 03/26/2021; patient is here for review of  a wound on his plantar right first toe History of Present Illness (HPI) ADMISSION 03/26/2021 This is a [age]-year-old man with type 2 diabetes and peripheral polyneuropathy. He noticed a wound on his distal phalanx of his right great toe several months ago. This did not make any progress after several visits to see Dr. 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 This is healed nicely however he developed a subsequent blister on the proximal phalanx remanent and has developed an open wound here and he is here for that reason. He has been using Santyl. Our intake nurse noted slight undermining. Dr. Doran Durand gave him a Darco forefoot offloading shoe although he could not wear this. He is now back into usual foot wear at work and he was wearing crocs although he figured this did not do the wound any good. Past medical history includes type 2 diabetes with peripheral neuropathy, Barrett's esophagus, stage III chronic renal failure, gout he takes Lasix as  needed ABI in our clinic clinic was noncompressible Patient History Information obtained from Patient, Chart. Allergies azithromycin (Reaction: hives) Family History Cancer - Father, Diabetes - Siblings,Father,Child, Heart Disease - Mother, No family history of Hereditary Spherocytosis, Hypertension, Kidney Disease, Lung Disease, Seizures, Stroke, Thyroid Problems, Tuberculosis. Social History Never smoker, Marital Status - Married, Alcohol Use - Never, Drug Use - No History, Caffeine Use - Daily. Medical History Eyes Denies history of Cataracts, Glaucoma, Optic Neuritis Ear/Nose/Mouth/Throat Denies history of Chronic sinus problems/congestion, Middle ear problems Cardiovascular Patient has history of Hypertension Endocrine Patient has history of Type II Diabetes Genitourinary Denies history of End Stage Renal Disease Integumentary (Skin) Denies history of History of Burn Musculoskeletal Patient has history of Gout Denies history of Rheumatoid Arthritis, Osteoarthritis, Osteomyelitis Neurologic Patient has history of Neuropathy Denies history of Dementia, Quadriplegia, Paraplegia, Seizure Disorder Oncologic Denies history of Received Chemotherapy, Received Radiation Psychiatric Denies history of Anorexia/bulimia, Confinement Anxiety Patient is treated with Insulin. Blood sugar is not tested. Hospitalization/Surgery History - rigth hallux amputation. - meniscus tears repairs. Medical A Surgical History Notes nd Cardiovascular Mitral valve prolapse Genitourinary CKD stage 3, barrett's esophagus Review of Systems (ROS) Constitutional Symptoms (General Health) Denies complaints or symptoms of Fatigue, Fever, Chills, Marked Weight Change. Eyes Complains or has symptoms of Glasses / Contacts. Denies complaints or symptoms of Dry Eyes, Vision Changes. Ear/Nose/Mouth/Throat Denies complaints or symptoms of Chronic sinus problems or rhinitis. Respiratory Denies complaints or  symptoms of Chronic or frequent coughs, Shortness of Breath. Cardiovascular Denies complaints or symptoms of Chest pain. Gastrointestinal Denies complaints or symptoms of Frequent diarrhea, Nausea, Vomiting. Endocrine Denies complaints or symptoms of Heat/cold intolerance. Genitourinary Denies complaints or symptoms of Frequent urination. Integumentary (Skin) Complains or has symptoms of Wounds - right great toe. Musculoskeletal Denies complaints or symptoms of Muscle Pain, Muscle Weakness. Neurologic Denies complaints or symptoms of Numbness/parasthesias. Psychiatric Denies complaints or symptoms of Claustrophobia, Suicidal. Objective Constitutional Patient is hypertensive.. Pulse regular and within target range for patient.Marland Kitchen Respirations regular, non-labored and within target range.. Temperature is normal and within the target range for the patient.Marland Kitchen Appears in no distress. Vitals Time Taken: 9:16 AM, Height: 73 in, Source: Stated, Weight: 203 lbs, Source: Stated, BMI: 26.8, Temperature: 98 F, Pulse: 80 bpm, Respiratory Rate: 18 breaths/min, Blood Pressure: 197/93 mmHg, Capillary Blood Glucose: 163 mg/dl. General Notes: glucose per pt report yesterday morning Cardiovascular Dorsalis pedis and posterior tibial pulse on the right is easily palpable. General Notes: Wound exam;  over the remaining proximal phalanx of the right great toe plantar aspect. Small circular wound some undermining is noted on the lateral aspect. Surface of the wound is fairly gritty. I used a #3 curette to remove the undermining area as well as a gritty surface on his toe hemostasis with direct pressure. No evidence of surrounding infection Integumentary (Hair, Skin) Wound #1 status is Open. Original cause of wound was Gradually Appeared. The date acquired was: 03/05/2021. The wound is located on the [employer]. The wound measures 0.7cm length x 0.7cm width x 0.1cm depth; 0.385cm^2 area and  0.038cm^3 volume. There is Fat Layer (Subcutaneous Tissue) exposed. There is no tunneling noted, however, there is undermining starting at 12:00 and ending at 8:00 with a maximum distance of 0.3cm. There is a medium amount of serosanguineous drainage noted. The wound margin is thickened. There is large (67-100%) red granulation within the wound bed. There is no necrotic tissue within the wound bed. Assessment Active Problems ICD-10 Type 2 diabetes mellitus with foot ulcer Non-pressure chronic ulcer of other part of right foot with other specified severity Type 2 diabetes mellitus with diabetic polyneuropathy Procedures Wound #1 Pre-procedure diagnosis of Wound #1 is a Diabetic Wound/Ulcer of the Lower Extremity located on the Right,Plantar T Great .Severity of Tissue Pre oe Debridement is: Fat layer exposed. There was a Excisional Skin/Subcutaneous Tissue Debridement with a total area of 0.81 sq cm performed by [name]., MD. With the following instrument(s): Curette to remove Viable and Non-Viable tissue/material. Material removed includes Callus, Subcutaneous Tissue, and Skin: Epidermis after achieving pain control using Lidocaine 4% Topical Solution. No specimens were taken. A time out was conducted at 10:00, prior to the start of the procedure. A Minimum amount of bleeding was controlled with Pressure. The procedure was tolerated well with a pain level of 0 throughout and a pain level of 0 following the procedure. Post Debridement Measurements: 0.9cm length x 0.9cm width x 0.1cm depth; 0.064cm^3 volume. Character of Wound/Ulcer Post Debridement is improved. Severity of Tissue Post Debridement is: Fat layer exposed. Post procedure Diagnosis Wound #1: Same as Pre-Procedure Plan Follow-up Appointments: Return Appointment in 1 week. - [name] room 1 Bathing/ Shower/ Hygiene: May shower with protection but do not get wound dressing(s) wet. Off-Loading: Wound #1 Right,Plantar T  Great: oe Open toe surgical shoe to: - right foot WOUND #1: - T Great Wound Laterality: Plantar, Right oe Prim Dressing: Promogran Prisma Matrix, 4.34 (sq in) (silver collagen) (Dispense As Written) Every Other Day/30 Days ary Discharge Instructions: Moisten collagen with saline or hydrogel Secondary Dressing: Woven Gauze Sponges 2x2 in Every Other Day/30 Days Discharge Instructions: Apply over primary dressing as directed. Secondary Dressing: Optifoam Non-Adhesive Dressing, 4x4 in (Generic) Every Other Day/30 Days Discharge Instructions: Apply over primary dressing as directed. Secured With: Child psychotherapist, Sterile 2x75 (in/in) (Generic) Every Other Day/30 Days Discharge Instructions: Secure with stretch gauze as directed. Secured With: 83M Medipore Public affairs consultant Surgical T 2x10 (in/yd) Every Other Day/30 Days ape Discharge Instructions: Secure with tape as directed. 1. The wound does not look particularly unhealthy and I think if we can relieve the pressure on this area it should epithelialize. 2. We are going to try to use a surgical shoe with a felt donut to support the toe. 3. 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 I saw  no evidence of infection the wound did not probe deeply. At this point I do not see any good reason to x-ray or culture this. 5. His ABIs were noncompressible on the right however he is peripheral pulses were easily palpable and his foot is warm at this point no additional vascular tests were ordered Electronic Signature(s) Signed: 03/26/2021 5:55:17 PM By: Linton Ham MD Entered By: Linton Ham on 03/26/2021 10:19:23 -------------------------------------------------------------------------------- HxROS Details Patient Name: Date of Service: [name] [name] [facility] C. 03/26/2021 9:00 A M Medical Record Number: [id number] Patient Account Number:  [id number] Date of Birth/Sex: Treating RN: [birth year] (66 y.o. M) Primary Care Provider: Marton Redwood Other Clinician: Referring Provider: Treating Provider/Extender: Estelle Grumbles in Treatment: 0 Information Obtained From Patient Chart Constitutional Symptoms (General Health) Complaints and Symptoms: Negative for: Fatigue; Fever; Chills; Marked Weight Change Eyes Complaints and Symptoms: Positive for: Glasses / Contacts Negative for: Dry Eyes; Vision Changes Medical History: Negative for: Cataracts; Glaucoma; Optic Neuritis Ear/Nose/Mouth/Throat Complaints and Symptoms: Negative for: Chronic sinus problems or rhinitis Medical History: Negative for: Chronic sinus problems/congestion; Middle ear problems Respiratory Complaints and Symptoms: Negative for: Chronic or frequent coughs; Shortness of Breath Cardiovascular Complaints and Symptoms: Negative for: Chest pain Medical History: Positive for: Hypertension Past Medical History Notes: Mitral valve prolapse Gastrointestinal Complaints and Symptoms: Negative for: Frequent diarrhea; Nausea; Vomiting Endocrine Complaints and Symptoms: Negative for: Heat/cold intolerance Medical History: Positive for: Type II Diabetes Time with diabetes: since 2010 Treated with: Insulin Blood sugar tested every day: No Genitourinary Complaints and Symptoms: Negative for: Frequent urination Medical History: Negative for: End Stage Renal Disease Past Medical History Notes: CKD stage 3, barrett's esophagus Integumentary (Skin) Complaints and Symptoms: Positive for: Wounds - right great toe Medical History: Negative for: History of Burn Musculoskeletal Complaints and Symptoms: Negative for: Muscle Pain; Muscle Weakness Medical History: Positive for: Gout Negative for: Rheumatoid Arthritis; Osteoarthritis; Osteomyelitis Neurologic Complaints and Symptoms: Negative for: Numbness/parasthesias Medical  History: Positive for: Neuropathy Negative for: Dementia; Quadriplegia; Paraplegia; Seizure Disorder Psychiatric Complaints and Symptoms: Negative for: Claustrophobia; Suicidal Medical History: Negative for: Anorexia/bulimia; Confinement Anxiety Hematologic/Lymphatic Immunological Oncologic Medical History: Negative for: Received Chemotherapy; Received Radiation Immunizations Pneumococcal Vaccine: Received Pneumococcal Vaccination: No Implantable Devices None Hospitalization / Surgery History Type of Hospitalization/Surgery rigth hallux amputation meniscus tears repairs Family and Social History Cancer: Yes - Father; Diabetes: Yes - Siblings,Father,Child; Heart Disease: Yes - Mother; Hereditary Spherocytosis: No; Hypertension: No; Kidney Disease: No; Lung Disease: No; Seizures: No; Stroke: No; Thyroid Problems: No; Tuberculosis: No; Never smoker; Marital Status - Married; Alcohol Use: Never; Drug Use: No History; Caffeine Use: Daily; Financial Concerns: No; Food, Clothing or Shelter Needs: No; Support System Lacking: No; Transportation Concerns: No Engineer, maintenance) Signed: 03/26/2021 5:28:56 PM By: Baruch Gouty RN, BSN Signed: 03/26/2021 5:55:17 PM By: Linton Ham MD Previous Signature: 03/25/2021 6:23:57 PM Version By: Valeria Batman EMT Entered By: Baruch Gouty on 03/26/2021 09:29:19 -------------------------------------------------------------------------------- [place] Details Patient Name: Date of Service: [address] [place] C. 03/26/2021 Medical Record Number: [id number] Patient Account Number: [id number] Date of Birth/Sex: Treating RN: 10-30-1955 (66 y.o. M) Primary Care Provider: Marton Redwood Other Clinician: Referring Provider: Treating Provider/Extender: Estelle Grumbles in Treatment: 0 Diagnosis Coding ICD-10 Codes Code Description [id number] Type 2 diabetes mellitus with foot ulcer L97.518 Non-pressure chronic ulcer of other  part of right foot with other specified severity E11.42 Type 2 diabetes mellitus with diabetic polyneuropathy Facility Procedures CPT4 Code: [id number] Description: [place]  VISIT-LEV 3 EST PT Modifier: 25 Quantity: 1 CPT4 Code: [id number] Description: [id number] - DEB SUBQ TISSUE 20 SQ CM/< ICD-10 Diagnosis Description E11.621 Type 2 diabetes mellitus with foot ulcer L97.518 Non-pressure chronic ulcer of other part of right foot with other specified sever E11.42 Type 2 diabetes mellitus with  diabetic polyneuropathy Modifier: ity Quantity: 1 Physician Procedures : CPT4 Code Description Modifier [id number] WC PHYS LEVEL 3 NEW PT 25 ICD-10 Diagnosis Description E11.621 Type 2 diabetes mellitus with foot ulcer L97.518 Non-pressure chronic ulcer of other part of right foot with other specified severity E11.42 Type 2  diabetes mellitus with diabetic polyneuropathy Quantity: 1 : [id number] - WC PHYS SUBQ TISS 20 SQ CM ICD-10 Diagnosis Description E11.621 Type 2 diabetes mellitus with foot ulcer L97.518 Non-pressure chronic ulcer of other part of right foot with other specified severity E11.42 Type 2 diabetes mellitus with  diabetic polyneuropathy Quantity: 1 Electronic Signature(s) Signed: 03/26/2021 5:28:56 PM By: Baruch Gouty RN, BSN Signed: 03/26/2021 5:55:17 PM By: Linton Ham MD Entered By: Baruch Gouty on 03/26/2021 10:32:13

## 2021-03-26 NOTE — Progress Notes (Signed)
[name], [name] ([id number]) Visit Report for 03/26/2021 Abuse Risk Screen Details Patient Name: Date of Service: [name] [name] [place] C. 03/26/2021 9:00 A M Medical Record Number: [id number] Patient Account Number: [id number] Date of Birth/Sex: Treating RN: 1955/02/12 (66 y.o. [name] [name] Primary Care Syerra Abdelrahman: Marton Redwood Other Clinician: Referring Arelyn Gauer: Treating Pride Gonzales/Extender: Estelle Grumbles in Treatment: 0 Abuse Risk Screen Items Answer ABUSE RISK SCREEN: Has anyone close to you tried to hurt or harm you recentlyo No Do you feel uncomfortable with anyone in your familyo No Has anyone forced you do things that you didnt want to doo No Electronic Signature(s) Signed: 03/26/2021 5:28:56 PM By: Baruch Gouty RN, BSN Entered By: Baruch Gouty on 03/26/2021 09:29:44 -------------------------------------------------------------------------------- Activities of Daily Living Details Patient Name: Date of Service: [name] [name] [place] C. 03/26/2021 9:00 A M Medical Record Number: [id number] Patient Account Number: [id number] Date of Birth/Sex: Treating RN: 07/23/1955 (66 y.o. [name] [name] Primary Care Lameeka Schleifer: Marton Redwood Other Clinician: Referring Jashanti Clinkscale: Treating Gadge Hermiz/Extender: Estelle Grumbles in Treatment: 0 Activities of Daily Living Items Answer Activities of Daily Living (Please select one for each item) Drive Automobile Completely Able T Medications ake Completely Able Use T elephone Completely Able Care for Appearance Completely Able Use T oilet Completely Able Bath / Shower Completely Able Dress Self Completely Able Feed Self Completely Able Walk Completely Able Get In / Out Bed Completely Able Housework Completely Able Prepare Meals Completely Able Handle Money Completely Able Shop for Self Completely Able Electronic Signature(s) Signed: 03/26/2021 5:28:56 PM By: Baruch Gouty RN, BSN Entered By:  Baruch Gouty on 03/26/2021 09:30:13 -------------------------------------------------------------------------------- Education Screening Details Patient Name: Date of Service: [address] [facility] C. 03/26/2021 9:00 A M Medical Record Number: [id number] Patient Account Number: [id number] Date of Birth/Sex: Treating RN: 25-Aug-1955 (66 y.o. [name] [name] Primary Care Laterria Lasota: Marton Redwood Other Clinician: Referring Lev Cervone: Treating Ritisha Deitrick/Extender: Estelle Grumbles in Treatment: 0 Primary Learner Assessed: Patient Learning Preferences/Education Level/Primary Language Learning Preference: Explanation, Demonstration, Printed Material Highest Education Level: College or Above Preferred Language: English Cognitive Barrier Language Barrier: No Translator Needed: No Memory Deficit: No Emotional Barrier: No Cultural/Religious Beliefs Affecting Medical Care: No Physical Barrier Impaired Vision: Yes Glasses Impaired Hearing: No Decreased Hand dexterity: No Knowledge/Comprehension Knowledge Level: High Comprehension Level: High Ability to understand written instructions: High Ability to understand verbal instructions: High Motivation Anxiety Level: Calm Cooperation: Cooperative Education Importance: Acknowledges Need Interest in Health Problems: Asks Questions Perception: Coherent Willingness to Engage in Self-Management High Activities: Readiness to Engage in Self-Management High Activities: Electronic Signature(s) Signed: 03/26/2021 5:28:56 PM By: Baruch Gouty RN, BSN Entered By: Baruch Gouty on 03/26/2021 09:30:44 -------------------------------------------------------------------------------- Fall Risk Assessment Details Patient Name: Date of Service: [address] [facility] C. 03/26/2021 9:00 A M Medical Record Number: [id number] Patient Account Number: [id number] Date of Birth/Sex: Treating RN: Dec 04, 1955 (66 y.o. [name] [name] Primary Care  Kiron Osmun: Marton Redwood Other Clinician: Referring Babara Buffalo: Treating Neeti Knudtson/Extender: Estelle Grumbles in Treatment: 0 Fall Risk Assessment Items Have you had 2 or more falls in the last 12 monthso 0 No Have you had any fall that resulted in injury in the last 12 monthso 0 No FALLS RISK SCREEN History of falling - immediate or within 3 months 0 No Secondary diagnosis (Do you have 2 or more medical diagnoseso) 0 No Ambulatory aid None/bed rest/wheelchair/nurse 0 Yes Crutches/cane/walker 0 No Furniture 0 No Intravenous therapy Access/Saline/Heparin  Lock 0 No Gait/Transferring Normal/ bed rest/ wheelchair 0 Yes Weak (short steps with or without shuffle, stooped but able to lift head while walking, may seek 0 No support from furniture) Impaired (short steps with shuffle, may have difficulty arising from chair, head down, impaired 0 No balance) Mental Status Oriented to own ability 0 Yes Electronic Signature(s) Signed: 03/26/2021 5:28:56 PM By: Baruch Gouty RN, BSN Entered By: Baruch Gouty on 03/26/2021 09:31:10 -------------------------------------------------------------------------------- Foot Assessment Details Patient Name: Date of Service: [name] [name] [place] [facility]. 03/26/2021 9:00 A M Medical Record Number: [id number] Patient Account Number: [id number] Date of Birth/Sex: Treating RN: 25-Oct-1955 (66 y.o. [name] [name] Primary Care Valkyrie Guardiola: Marton Redwood Other Clinician: Referring Ayeisha Lindenberger: Treating Krew Hortman/Extender: Estelle Grumbles in Treatment: 0 Foot Assessment Items Site Locations + = Sensation present, - = Sensation absent, C = Callus, U = Ulcer R = Redness, W = Warmth, M = Maceration, PU = Pre-ulcerative lesion F = Fissure, S = Swelling, D = Dryness Assessment Right: Left: Other Deformity: No No Prior Foot Ulcer: Yes No Prior Amputation: Yes No Charcot Joint: No No Ambulatory Status: Ambulatory Without Help Gait:  Steady Electronic Signature(s) Signed: 03/26/2021 5:28:56 PM By: Baruch Gouty RN, BSN Entered By: Baruch Gouty on 03/26/2021 09:33:29 -------------------------------------------------------------------------------- Nutrition Risk Screening Details Patient Name: Date of Service: [address] HN C. 03/26/2021 9:00 A M Medical Record Number: [id number] Patient Account Number: [id number] Date of Birth/Sex: Treating RN: [birth year] (66 y.o. [name] [name] Primary Care Carigan Lister: Marton Redwood Other Clinician: Referring Annalynne Ibanez: Treating Muaaz Brau/Extender: Estelle Grumbles in Treatment: 0 Height (in): 73 Weight (lbs): 203 Body Mass Index (BMI): 26.8 Nutrition Risk Screening Items Score Screening NUTRITION RISK SCREEN: I have an illness or condition that made me change the kind and/or amount of food I eat 0 No I eat fewer than two meals per day 0 No I eat few fruits and vegetables, or milk products 0 No I have three or more drinks of beer, liquor or wine almost every day 0 No I have tooth or mouth problems that make it hard for me to eat 0 No I don't always have enough money to buy the food I need 0 No I eat alone most of the time 0 No I take three or more different prescribed or over-the-counter drugs a day 1 Yes Without wanting to, I have lost or gained 10 pounds in the last six months 0 No I am not always physically able to shop, cook and/or feed myself 0 No Nutrition Protocols Good Risk Protocol 0 No interventions needed Moderate Risk Protocol High Risk Proctocol Risk Level: Good Risk Score: 1 Electronic Signature(s) Signed: 03/26/2021 5:28:56 PM By: Baruch Gouty RN, BSN Entered By: Baruch Gouty on 03/26/2021 09:31:51

## 2021-04-02 ENCOUNTER — Other Ambulatory Visit: Payer: Self-pay

## 2021-04-02 ENCOUNTER — Encounter (HOSPITAL_BASED_OUTPATIENT_CLINIC_OR_DEPARTMENT_OTHER): Payer: Managed Care, Other (non HMO) | Admitting: General Surgery

## 2021-04-02 DIAGNOSIS — E11621 Type 2 diabetes mellitus with foot ulcer: Secondary | ICD-10-CM | POA: Diagnosis not present

## 2021-04-02 NOTE — Progress Notes (Signed)
[name] [name], [name] C. ([id number]) Visit Report for 04/02/2021 Chief Complaint Document Details Patient Name: Date of Service: [name] [name], [name]. 04/02/2021 8:15 A M Medical Record Number: [id number] Patient Account Number: [id number] Date of Birth/Sex: Treating [name]: 10/15/1955 (66 y.o. [name] [name]) Boehlein, [name] Primary Care Provider: Martha ClanShaw, [name] Other Clinician: Referring Provider: Treating Provider/Extender: [name] [name] Weeks in Treatment: 1 Information Obtained from: Patient Chief Complaint 04/02/2021: patient is here for review of a wound on his plantar right first toe Electronic Signature(s) Signed: 04/02/2021 8:35:23 AM By: Duanne Guessannon, Lavera Vandermeer MD FACS Entered By: Duanne Guessannon, Clearnce Leja on 04/02/2021 08:35:23 -------------------------------------------------------------------------------- Debridement Details Patient Name: [place] [address] [name], [name] [place] C. 04/02/2021 8:15 A M Medical Record Number: [id number] Patient Account Number: [id number] Date of Birth/Sex: Treating [name]: 08/11/1955 (66 y.o. [name] [name]) Scotton, [name] [name] Other Clinician: Referring Provider: Treating Provider/Extender: [name] [name] Weeks in Treatment: 1 Debridement Performed for Assessment: Wound #1 Right,Plantar T Great oe Performed By: Physician Duanne Guessannon, Lexany Belknap, MD Debridement Type: Debridement Severity of Tissue Pre Debridement: Fat layer exposed Level of Consciousness (Pre-procedure): Awake and Alert Pre-procedure Verification/Time Out Yes - 08:23 Taken: Start Time: 08:23 Pain Control: Lidocaine 4% T opical Solution T Area Debrided (L x W): otal 0.7 (cm) x 0.5 (cm) = 0.35 (cm) Tissue and other material debrided: Non-Viable, Callus Level: Non-Viable Tissue Debridement Description: Selective/Open Wound Instrument: Curette Bleeding: Minimum Hemostasis Achieved: Pressure End Time: 08:25 Procedural Pain: 0 Post Procedural Pain: 0 Response to Treatment:  Procedure was tolerated well Level of Consciousness (Post- Awake and Alert procedure): Post Debridement Measurements of Total Wound Length: (cm) 0.7 Width: (cm) 0.5 Depth: (cm) 0.1 Volume: (cm) 0.027 Character of Wound/Ulcer Post Debridement: Improved Severity of Tissue Post Debridement: Fat layer exposed Post Procedure Diagnosis Same as Pre-procedure Electronic Signature(s) Signed: 04/02/2021 8:41:53 AM By: Duanne Guessannon, Lowana Hable MD FACS Signed: 04/02/2021 11:32:21 AM By: [name] [name] Entered By: [name] SchwalbeScotton, [name] on 04/02/2021 08:27:13 -------------------------------------------------------------------------------- HPI Details Patient Name: Date of Service: [name] [name], [name] HN C. 04/02/2021 8:15 A M Medical Record Number: [id number] Patient Account Number: [id number] Date of Birth/Sex: Treating [name]: 03/23/1955 (66 y.o. [name] [name]) Boehlein, [name] Primary Care Provider: Martha ClanShaw, [name] Other Clinician: Referring Provider: Treating Provider/Extender: [name] Leighannon, Pammie Chirino Shaw, [name] Weeks in Treatment: 1 History of Present Illness HPI Description: ADMISSION 03/26/2021 This is a [age]-year-old man with type 2 diabetes and peripheral polyneuropathy. He noticed a wound on his distal phalanx of his right great toe several months ago. This did not make any progress after several visits to see Dr. 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 This is healed nicely however he developed a subsequent blister on the proximal phalanx remanent and has developed an open wound here and he is here for that reason. He has been using Santyl. Our intake nurse noted slight undermining. Dr. Victorino DikeHewitt gave him a Darco forefoot offloading shoe although he could not wear this. He is now back into usual foot wear at work and he was wearing crocs although he figured this did not do the wound any good. Past medical history includes type 2 diabetes with peripheral neuropathy, Barrett's  esophagus, stage III chronic renal failure, gout he takes Lasix as needed ABI in our clinic clinic was noncompressible 04/02/2021: The patient has been compliant with offloading. He feels like the wound is improving, although he admits that it is difficult for him to adequately visualize it on the bottom  of his toe. He has been wearing his surgical shoe and is currently in silver collagen with an OPTi foam donut surrounding the site. There is some callus buildup around the edges of the wound, but overall the longitudinal measurement and undermining have decreased. Electronic Signature(s) Signed: 04/02/2021 8:37:00 AM By: Duanne Guess MD FACS Entered By: Duanne Guess on 04/02/2021 08:37:00 -------------------------------------------------------------------------------- Physical Exam Details Patient Name: Date of Service: [address] [place] C. 04/02/2021 8:15 A M Medical Record Number: [id number] Patient Account Number: [id number] Date of Birth/Sex: Treating [name]: December 01, 1955 (66 y.o. [name] Schooner Primary Care Provider: [name] Other Clinician: Referring Provider: Treating Provider/Extender: [name] Leigh in Treatment: 1 Constitutional . . No acute distress. Notes 04/02/2021: Wound exam--on the plantar surface of the right great toe, there is a small circular wound. There is minimal undermining on today's inspection. Wound surface is red and moist. There has been some callus buildup with heme deposition on the medial aspect, but without significant undermining in this location. This was debrided back to better saucerized the wound as well as remove nonviable tissue. No concern for infection. Electronic Signature(s) Signed: 04/02/2021 8:39:13 AM By: Duanne Guess MD FACS Entered By: Duanne Guess on 04/02/2021 08:39:13 -------------------------------------------------------------------------------- Physician Orders Details Patient Name: Date of  Service: [address] [place] C. 04/02/2021 8:15 A M Medical Record Number: [id number] Patient Account Number: [id number] Date of Birth/Sex: Treating [name]: 09-12-1955 (66 y.o. [name] Limbo Primary Care Provider: [name] Other Clinician: Referring Provider: Treating Provider/Extender: [name] Leigh in Treatment: 1 Verbal / Phone Orders: No Diagnosis Coding ICD-10 Coding Code Description E11.621 Type 2 diabetes mellitus with foot ulcer L97.518 Non-pressure chronic ulcer of other part of right foot with other specified severity E11.42 Type 2 diabetes mellitus with diabetic polyneuropathy Follow-up Appointments ppointment in 1 week. - Dr. Lady Gary room 1 Return A Bathing/ Shower/ Hygiene May shower with protection but do not get wound dressing(s) wet. Off-Loading Wound #1 Right,Plantar T Great oe Open toe surgical shoe to: - right foot Wound Treatment Wound #1 - T Great oe Wound Laterality: Plantar, Right Prim Dressing: Promogran Prisma Matrix, 4.34 (sq in) (silver collagen) (Dispense As Written) Every Other Day/30 Days ary Discharge Instructions: Moisten collagen with saline or hydrogel Secondary Dressing: Woven Gauze Sponges 2x2 in Every Other Day/30 Days Discharge Instructions: Apply over primary dressing as directed. Secondary Dressing: Optifoam Non-Adhesive Dressing, 4x4 in (Generic) Every Other Day/30 Days Discharge Instructions: Apply over primary dressing as directed. Secured With: Insurance underwriter, Sterile 2x75 (in/in) (Generic) Every Other Day/30 Days Discharge Instructions: Secure with stretch gauze as directed. Secured With: 68M Medipore Scientist, research (life sciences) Surgical T 2x10 (in/yd) Every Other Day/30 Days ape Discharge Instructions: Secure with tape as directed. Electronic Signature(s) Signed: 04/02/2021 8:41:53 AM By: Duanne Guess MD FACS Entered By: Duanne Guess on 04/02/2021  08:39:59 -------------------------------------------------------------------------------- Problem List Details Patient Name: Date of Service: [address] [place] C. 04/02/2021 8:15 A M Medical Record Number: [id number] Patient Account Number: [id number] Date of Birth/Sex: Treating [name]: [birth year] (66 y.o. [name] Schooner Primary Care Provider: [name] Other Clinician: Referring Provider: Treating Provider/Extender: [name] Leigh in Treatment: 1 Active Problems ICD-10 Encounter Code Description Active Date MDM Diagnosis E11.621 Type 2 diabetes mellitus with foot ulcer 03/26/2021 No Yes L97.518 Non-pressure chronic ulcer of other part of right foot with other specified 03/26/2021 No Yes severity E11.42 Type 2 diabetes mellitus with diabetic polyneuropathy 03/26/2021 No Yes  Inactive Problems Resolved Problems Electronic Signature(s) Signed: 04/02/2021 8:34:27 AM By: Duanne Guess MD FACS Entered By: Duanne Guess on 04/02/2021 08:34:27 -------------------------------------------------------------------------------- Progress Note Details Patient Name: Date of Service: [address] [place] C. 04/02/2021 8:15 A M Medical Record Number: [id number] Patient Account Number: [id number] Date of Birth/Sex: Treating [name]: July 19, 1955 (66 y.o. [name] Schooner Primary Care Provider: [name] Other Clinician: Referring Provider: Treating Provider/Extender: [name] Leigh in Treatment: 1 Subjective Chief Complaint Information obtained from Patient 04/02/2021: patient is here for review of a wound on his plantar right first toe History of Present Illness (HPI) ADMISSION 03/26/2021 This is a [age]-year-old man with type 2 diabetes and peripheral polyneuropathy. He noticed a wound on his distal phalanx of his right great toe several months ago. This did not make any progress after several visits to see Dr. Victorino Dike and he went underwent a amputation  soon through the interphalangeal joint on January 26. This is healed nicely however he developed a subsequent blister on the proximal phalanx remanent and has developed an open wound here and he is here for that reason. He has been using Santyl. Our intake nurse noted slight undermining. Dr. Victorino Dike gave him a Darco forefoot offloading shoe although he could not wear this. He is now back into usual foot wear at work and he was wearing crocs although he figured this did not do the wound any good. Past medical history includes type 2 diabetes with peripheral neuropathy, Barrett's esophagus, stage III chronic renal failure, gout he takes Lasix as needed ABI in our clinic clinic was noncompressible 04/02/2021: The patient has been compliant with offloading. He feels like the wound is improving, although he admits that it is difficult for him to adequately visualize it on the bottom of his toe. He has been wearing his surgical shoe and is currently in silver collagen with an OPTi foam donut surrounding the site. There is some callus buildup around the edges of the wound, but overall the longitudinal measurement and undermining have decreased. Patient History Information obtained from Patient, Chart. Family History Cancer - Father, Diabetes - Siblings,Father,Child, Heart Disease - Mother, No family history of Hereditary Spherocytosis, Hypertension, Kidney Disease, Lung Disease, Seizures, Stroke, Thyroid Problems, Tuberculosis. Social History Never smoker, Marital Status - Married, Alcohol Use - Never, Drug Use - No History, Caffeine Use - Daily. Medical History Eyes Denies history of Cataracts, Glaucoma, Optic Neuritis Ear/Nose/Mouth/Throat Denies history of Chronic sinus problems/congestion, Middle ear problems Cardiovascular Patient has history of Hypertension Endocrine Patient has history of Type II Diabetes Genitourinary Denies history of End Stage Renal Disease Integumentary (Skin) Denies  history of History of Burn Musculoskeletal Patient has history of Gout Denies history of Rheumatoid Arthritis, Osteoarthritis, Osteomyelitis Neurologic Patient has history of Neuropathy Denies history of Dementia, Quadriplegia, Paraplegia, Seizure Disorder Oncologic Denies history of Received Chemotherapy, Received Radiation Psychiatric Denies history of Anorexia/bulimia, Confinement Anxiety Hospitalization/Surgery History - rigth hallux amputation. - meniscus tears repairs. Medical A Surgical History Notes nd Cardiovascular Mitral valve prolapse Genitourinary CKD stage 3, barrett's esophagus Objective Constitutional No acute distress. Vitals Time Taken: 8:00 AM, Height: 73 in, Weight: 203 lbs, BMI: 26.8, Temperature: 97.6 F, Pulse: 78 bpm, Respiratory Rate: 18 breaths/min, Blood Pressure: 133/97 mmHg. General Notes: 04/02/2021: Wound exam--on the plantar surface of the right great toe, there is a small circular wound. There is minimal undermining on today's inspection. Wound surface is red and moist. There has been some callus buildup with heme deposition on  the medial aspect, but without significant undermining in this location. This was debrided back to better saucerized the wound as well as remove nonviable tissue. No concern for infection. Integumentary (Hair, Skin) Wound #1 status is Open. Original cause of wound was Gradually Appeared. The date acquired was: 03/05/2021. The wound has been in treatment 1 weeks. The wound is located on the [employer]. The wound measures 0.7cm length x 0.5cm width x 0.1cm depth; 0.275cm^2 area and 0.027cm^3 volume. oe There is Fat Layer (Subcutaneous Tissue) exposed. There is no tunneling noted, however, there is undermining starting at 12:00 and ending at 8:00 with a maximum distance of 0.2cm. There is a medium amount of serosanguineous drainage noted. The wound margin is thickened. There is large (67-100%) red granulation within the  wound bed. There is no necrotic tissue within the wound bed. Assessment Active Problems ICD-10 Type 2 diabetes mellitus with foot ulcer Non-pressure chronic ulcer of other part of right foot with other specified severity Type 2 diabetes mellitus with diabetic polyneuropathy Procedures Wound #1 Pre-procedure diagnosis of Wound #1 is a Diabetic Wound/Ulcer of the Lower Extremity located on the Right,Plantar T Great .Severity of Tissue Pre oe Debridement is: Fat layer exposed. There was a Selective/Open Wound Non-Viable Tissue Debridement with a total area of 0.35 sq cm performed by Duanne Guess, MD. With the following instrument(s): Curette to remove Non-Viable tissue/material. Material removed includes Callus after achieving pain control using Lidocaine 4% T opical Solution. No specimens were taken. A time out was conducted at 08:23, prior to the start of the procedure. A Minimum amount of bleeding was controlled with Pressure. The procedure was tolerated well with a pain level of 0 throughout and a pain level of 0 following the procedure. Post Debridement Measurements: 0.7cm length x 0.5cm width x 0.1cm depth; 0.027cm^3 volume. Character of Wound/Ulcer Post Debridement is improved. Severity of Tissue Post Debridement is: Fat layer exposed. Post procedure Diagnosis Wound #1: Same as Pre-Procedure Plan Follow-up Appointments: Return Appointment in 1 week. - Dr. Lady Gary room 1 Bathing/ Shower/ Hygiene: May shower with protection but do not get wound dressing(s) wet. Off-Loading: Wound #1 Right,Plantar T Great: oe Open toe surgical shoe to: - right foot WOUND #1: - T Great Wound Laterality: Plantar, Right oe Prim Dressing: Promogran Prisma Matrix, 4.34 (sq in) (silver collagen) (Dispense As Written) Every Other Day/30 Days ary Discharge Instructions: Moisten collagen with saline or hydrogel Secondary Dressing: Woven Gauze Sponges 2x2 in Every Other Day/30 Days Discharge Instructions:  Apply over primary dressing as directed. Secondary Dressing: Optifoam Non-Adhesive Dressing, 4x4 in (Generic) Every Other Day/30 Days Discharge Instructions: Apply over primary dressing as directed. Secured With: Insurance underwriter, Sterile 2x75 (in/in) (Generic) Every Other Day/30 Days Discharge Instructions: Secure with stretch gauze as directed. Secured With: 63M Med[employer] Scientist, research (life sciences) Surgical T 2x10 (in/yd) Every Other Day/30 Days ape Discharge Instructions: Secure with tape as directed. 04/02/2021: The wound is showing evidence of healing with his current regimen. He will continue to offload with the open toe surgical shoe as well as the Optifoam padding donut. We will continue using the Promogran silver collagen in the wound. I will see him in 1 week for reevaluation. Electronic Signature(s) Signed: 04/02/2021 8:41:17 AM By: Duanne Guess MD FACS Entered By: Duanne Guess on 04/02/2021 08:41:17 -------------------------------------------------------------------------------- HxROS Details Patient Name: Date of Service: [address] [place] C. 04/02/2021 8:15 A M Medical Record Number: [id number] Patient Account Number: [id number] Date of Birth/Sex: Treating [name]: Dec 09, 1955 (  66 y.o. [name] [name]) Boehlein, [name] Primary Care Provider: Martha ClanShaw, [name] Other Clinician: Referring Provider: Treating Provider/Extender: [name] Leighannon, Jassmine Vandruff Shaw, [name] Weeks in Treatment: 1 Information Obtained From Patient Chart Eyes Medical History: Negative for: Cataracts; Glaucoma; Optic Neuritis Ear/Nose/Mouth/Throat Medical History: Negative for: Chronic sinus problems/congestion; Middle ear problems Cardiovascular Medical History: Positive for: Hypertension Past Medical History Notes: Mitral valve prolapse Endocrine Medical History: Positive for: Type II Diabetes Time with diabetes: since 2010 Treated with: Insulin Blood sugar tested every day: No Genitourinary Medical History: Negative  for: End Stage Renal Disease Past Medical History Notes: CKD stage 3, barrett's esophagus Integumentary (Skin) Medical History: Negative for: History of Burn Musculoskeletal Medical History: Positive for: Gout Negative for: Rheumatoid Arthritis; Osteoarthritis; Osteomyelitis Neurologic Medical History: Positive for: Neuropathy Negative for: Dementia; Quadriplegia; Paraplegia; Seizure Disorder Oncologic Medical History: Negative for: Received Chemotherapy; Received Radiation Psychiatric Medical History: Negative for: Anorexia/bulimia; Confinement Anxiety Immunizations Pneumococcal Vaccine: Received Pneumococcal Vaccination: No Implantable Devices None Hospitalization / Surgery History Type of Hospitalization/Surgery rigth hallux amputation meniscus tears repairs Family and Social History Cancer: Yes - Father; Diabetes: Yes - Siblings,Father,Child; Heart Disease: Yes - Mother; Hereditary Spherocytosis: No; Hypertension: No; Kidney Disease: No; Lung Disease: No; Seizures: No; Stroke: No; Thyroid Problems: No; Tuberculosis: No; Never smoker; Marital Status - Married; Alcohol Use: Never; Drug Use: No History; Caffeine Use: Daily; Financial Concerns: No; Food, Clothing or Shelter Needs: No; Support System Lacking: No; Transportation Concerns: No Physician Affirmation I have reviewed and agree with the above information. Electronic Signature(s) Signed: 04/02/2021 8:41:53 AM By: Duanne Guessannon, Adrie Picking MD FACS Signed: 04/02/2021 5:51:22 PM By: Zenaida DeedBoehlein, Linda [name], BSN Entered By: Duanne Guessannon, Aleira Deiter on 04/02/2021 08:37:13 -------------------------------------------------------------------------------- SuperBill Details Patient Name: Date of Service[address] [name], [name]. 04/02/2021 Medical Record Number: [id number] Patient Account Number: [id number] Date of Birth/Sex: Treating [name]: 02/26/1955 (66 y.o. [name] [name]) Boehlein, [name] Primary Care Provider: Martha ClanShaw, [name] Other Clinician: Referring  Provider: Treating Provider/Extender: [name] Leighannon, [place] Pirie Shaw, [name] Weeks in Treatment: 1 Diagnosis Coding ICD-10 Codes Code Description [phone] Type 2 diabetes mellitus with foot ulcer L97.518 Non-pressure chronic ulcer of other part of right foot with other specified severity E11.42 Type 2 diabetes mellitus with diabetic polyneuropathy Facility Procedures CPT4 Code: [id number] Description: [phone] - DEBRIDE WOUND 1ST 20 SQ CM OR < ICD-10 Diagnosis Description L97.518 Non-pressure chronic ulcer of other part of right foot with other specified sever Modifier: ity Quantity: 1 Physician Procedures : CPT4 Code Description Modifier [id number] - WC PHYS DEBR WO ANESTH 20 SQ CM ICD-10 Diagnosis Description L97.518 Non-pressure chronic ulcer of other part of right foot with other specified severity Quantity: 1 Electronic Signature(s) Signed: 04/02/2021 8:41:31 AM By: Duanne Guessannon, Arlee Santosuosso MD FACS Entered By: Duanne Guessannon, Calden Dorsey on 04/02/2021 08:41:30

## 2021-04-02 NOTE — Progress Notes (Signed)
[name] [name] ([id number]) Visit Report for 04/02/2021 Arrival Information Details Patient Name: Date of Service: [name] [name] [place] C. 04/02/2021 8:15 A M Medical Record Number: [id number] Patient Account Number: [id number] Date of Birth/Sex: Treating RN: 14-Jun-1955 (66 y.o. [name] [name] Primary Care Kemontae Dunklee: [name] Other Clinician: Referring Harley Fitzwater: Treating Keniah Klemmer/Extender: [name] in Treatment: 1 Visit Information History Since Last Visit Added or deleted any medications: No Patient Arrived: Ambulatory Any new allergies or adverse reactions: No Arrival Time: 07:57 Had a fall or experienced change in No Accompanied By: self activities of daily living that may affect Transfer Assistance: None risk of falls: Patient Identification Verified: Yes Signs or symptoms of abuse/neglect since last visito No Patient Requires Transmission-Based Precautions: No Hospitalized since last visit: No Patient Has Alerts: Yes Implantable device outside of the clinic excluding No Patient Alerts: R ABI N/P cellular tissue based products placed in the center since last visit: Has Dressing in Place as Prescribed: Yes Pain Present Now: No Electronic Signature(s) Signed: 04/02/2021 11:32:21 AM By: Karie Schwalbe RN Entered By: Karie Schwalbe on 04/02/2021 07:58:06 -------------------------------------------------------------------------------- Encounter Discharge Information Details Patient Name: Date of Service: [address] [facility]. 04/02/2021 8:15 A M Medical Record Number: [id number] Patient Account Number: [id number] Date of Birth/Sex: Treating RN: [birth year] (66 y.o. [name] [name] Primary Care [name]: [name] Other Clinician: Referring Jaide Hillenburg: Treating Laguana Desautel/Extender: [name] in Treatment: 1 Encounter Discharge Information Items Post Procedure Vitals Discharge Condition: Stable Temperature (F):  97.6 Ambulatory Status: Ambulatory Pulse (bpm): 78 Discharge Destination: Home Respiratory Rate (breaths/min): 18 Transportation: Private Auto Blood Pressure (mmHg): 133/97 Accompanied By: self Schedule Follow-up Appointment: Yes Clinical Summary of Care: Patient Declined Electronic Signature(s) Signed: 04/02/2021 11:32:21 AM By: Karie Schwalbe RN Entered By: Karie Schwalbe on 04/02/2021 09:18:24 -------------------------------------------------------------------------------- Lower Extremity Assessment Details Patient Name: Date of Service: [address] [place] C. 04/02/2021 8:15 A M Medical Record Number: [id number] Patient Account Number: [id number] Date of Birth/Sex: Treating RN: 12-Nov-1955 (66 y.o. [name] [name] Primary Care Karan Inclan: [name] Other Clinician: Referring Camille Thau: Treating [name]/Extender: [name] in Treatment: 1 Edema Assessment Assessed: [Left: No] [Right: No] Edema: [Left: N] [Right: o] Calf Left: Right: Point of Measurement: From Medial Instep 41 cm Ankle Left: Right: Point of Measurement: From Medial Instep 24 cm Electronic Signature(s) Signed: 04/02/2021 11:32:21 AM By: Karie Schwalbe RN Entered By: Karie Schwalbe on 04/02/2021 08:02:24 -------------------------------------------------------------------------------- Multi Wound Chart Details Patient Name: Date of Service: [address] [facility]. 04/02/2021 8:15 A M Medical Record Number: [id number] Patient Account Number: [id number] Date of Birth/Sex: Treating RN: 10-17-1955 (66 y.o. [name] [name] Primary Care Yeilin Zweber: [name] Other Clinician: Referring Lexie Koehl: Treating Darriel Utter/Extender: [name] in Treatment: 1 Vital Signs Height(in): 73 Pulse(bpm): 78 Weight(lbs): 203 Blood Pressure(mmHg): 133/97 Body Mass Index(BMI): 26.8 Temperature(F): 97.6 Respiratory Rate(breaths/min): 18 Photos: [N/A:N/A] Right,  Plantar T Great oe N/A N/A Wound Location: Gradually Appeared N/A N/A Wounding Event: Diabetic Wound/Ulcer of the Lower N/A N/A Primary Etiology: Extremity Hypertension, Type II Diabetes, Gout, N/A N/A Comorbid History: Neuropathy 03/05/2021 N/A N/A Date Acquired: 1 N/A N/A Weeks of Treatment: Open N/A N/A Wound Status: No N/A N/A Wound Recurrence: 0.7x0.5x0.1 N/A N/A Measurements L x W x D (cm) 0.275 N/A N/A A (cm) : rea 0.027 N/A N/A Volume (cm) : 28.60% N/A N/A % Reduction in A rea: 28.90% N/A N/A % Reduction in Volume: 12 Starting  Position 1 (o'clock): 8 Ending Position 1 (o'clock): 0.2 Maximum Distance 1 (cm): Yes N/A N/A Undermining: Grade 1 N/A N/A Classification: Medium N/A N/A Exudate A mount: Serosanguineous N/A N/A Exudate Type: red, brown N/A N/A Exudate Color: Thickened N/A N/A Wound Margin: Large (67-100%) N/A N/A Granulation A mount: Red N/A N/A Granulation Quality: None Present (0%) N/A N/A Necrotic A mount: Fat Layer (Subcutaneous Tissue): Yes N/A N/A Exposed Structures: Fascia: No Tendon: No Muscle: No Joint: No Bone: No None N/A N/A Epithelialization: Debridement - Selective/Open Wound N/A N/A Debridement: Pre-procedure Verification/Time Out 08:23 N/A N/A Taken: Lidocaine 4% Topical Solution N/A N/A Pain Control: Callus N/A N/A Tissue Debrided: Non-Viable Tissue N/A N/A Level: 0.35 N/A N/A Debridement A (sq cm): rea Curette N/A N/A Instrument: Minimum N/A N/A Bleeding: Pressure N/A N/A Hemostasis A chieved: 0 N/A N/A Procedural Pain: 0 N/A N/A Post Procedural Pain: Procedure was tolerated well N/A N/A Debridement Treatment Response: 0.7x0.5x0.1 N/A N/A Post Debridement Measurements L x W x D (cm) 0.027 N/A N/A Post Debridement Volume: (cm) Debridement N/A N/A Procedures Performed: Treatment Notes Electronic Signature(s) Signed: 04/02/2021 8:35:00 AM By: Duanne Guess MD FACS Signed: 04/02/2021  5:51:22 PM By: [name] RN, BSN Entered By: Duanne Guess on 04/02/2021 08:35:00 -------------------------------------------------------------------------------- Multi-Disciplinary Care Plan Details Patient Name: Date of Service: [address] [place] C. 04/02/2021 8:15 A M Medical Record Number: [id number] Patient Account Number: [id number] Date of Birth/Sex: Treating RN: 08/30/1955 (66 y.o. [name] [name] Primary Care Tylyn Derwin: [name] Other Clinician: Referring Gurtha Picker: Treating Darshan Solanki/Extender: [name] in Treatment: 1 Multidisciplinary Care Plan reviewed with physician Active Inactive Nutrition Nursing Diagnoses: Impaired glucose control: actual or potential Potential for alteratiion in Nutrition/Potential for imbalanced nutrition Goals: Patient/caregiver will maintain therapeutic glucose control Date Initiated: 03/26/2021 Target Resolution Date: 04/23/2021 Goal Status: Active Interventions: Assess patient nutrition upon admission and as needed per policy Provide education on elevated blood sugars and impact on wound healing Treatment Activities: Patient referred to Primary Care Physician for further nutritional evaluation : 03/26/2021 Notes: Wound/Skin Impairment Nursing Diagnoses: Impaired tissue integrity Knowledge deficit related to ulceration/compromised skin integrity Goals: Patient/caregiver will verbalize understanding of skin care regimen Date Initiated: 03/26/2021 Target Resolution Date: 04/23/2021 Goal Status: Active Ulcer/skin breakdown will have a volume reduction of 30% by week 4 Date Initiated: 03/26/2021 Target Resolution Date: 04/23/2021 Goal Status: Active Interventions: Assess patient/caregiver ability to obtain necessary supplies Assess patient/caregiver ability to perform ulcer/skin care regimen upon admission and as needed Assess ulceration(s) every visit Provide education on ulcer and skin care Treatment  Activities: Skin care regimen initiated : 03/26/2021 Topical wound management initiated : 03/26/2021 Notes: Electronic Signature(s) Signed: 04/02/2021 11:32:21 AM By: Karie Schwalbe RN Entered By: Karie Schwalbe on 04/02/2021 09:14:47 -------------------------------------------------------------------------------- Pain Assessment Details Patient Name: Date of Service: [name] [name] [place] C. 04/02/2021 8:15 A M Medical Record Number: [id number] Patient Account Number: [id number] Date of Birth/Sex: Treating RN: August 18, 1955 (66 y.o. [name] [name] Primary Care Rithika Seel: [name] Other Clinician: Referring Rheannon Cerney: Treating Jamarion Jumonville/Extender: [name] in Treatment: 1 Active Problems Location of Pain Severity and Description of Pain Patient Has Paino No Site Locations Pain Management and Medication Current Pain Management: Electronic Signature(s) Signed: 04/02/2021 11:32:21 AM By: Karie Schwalbe RN Entered By: Karie Schwalbe on 04/02/2021 08:01:48 -------------------------------------------------------------------------------- Patient/Caregiver Education Details Patient Name: Date of Service: Deretha Emory 2/23/2023andnbsp8:15 A M Medical Record Number: [id number] Patient Account Number: [id number] Date of Birth/Gender: Treating RN:  [birth year] (66 y.o. [name] [name] Primary Care Physician: [name] Other Clinician: Referring Physician: Treating Physician/Extender: [name] in Treatment: 1 Education Assessment Education Provided To: Patient Education Topics Provided Wound/Skin Impairment: Methods: Explain/Verbal Responses: Return demonstration correctly Electronic Signature(s) Signed: 04/02/2021 11:32:21 AM By: Karie Schwalbe RN Entered By: Karie Schwalbe on 04/02/2021 09:15:17 -------------------------------------------------------------------------------- Wound Assessment Details Patient  Name: Date of Service: [address] [place] [facility]. 04/02/2021 8:15 A M Medical Record Number: [id number] Patient Account Number: [id number] Date of Birth/Sex: Treating RN: [birth year] (66 y.o. [name] [name] Primary Care Fanny Agan: [name] Other Clinician: Referring Elexia Friedt: Treating Pellegrino Kennard/Extender: [name] in Treatment: 1 Wound Status Wound Number: 1 Primary Etiology: Diabetic Wound/Ulcer of the Lower Extremity Wound Location: Right, Plantar T Great oe Wound Status: Open Wounding Event: Gradually Appeared Comorbid History: Hypertension, Type II Diabetes, Gout, Neuropathy Date Acquired: 03/05/2021 Weeks Of Treatment: 1 Clustered Wound: No Photos Wound Measurements Length: (cm) 0.7 Width: (cm) 0.5 Depth: (cm) 0.1 Area: (cm) 0.275 Volume: (cm) 0.027 % Reduction in Area: 28.6% % Reduction in Volume: 28.9% Epithelialization: None Tunneling: No Undermining: Yes Starting Position (o'clock): 12 Ending Position (o'clock): 8 Maximum Distance: (cm) 0.2 Wound Description Classification: Grade 1 Wound Margin: Thickened Exudate Amount: Medium Exudate Type: Serosanguineous Exudate Color: red, brown Foul Odor After Cleansing: No Slough/Fibrino No Wound Bed Granulation Amount: Large (67-100%) Exposed Structure Granulation Quality: Red Fascia Exposed: No Necrotic Amount: None Present (0%) Fat Layer (Subcutaneous Tissue) Exposed: Yes Tendon Exposed: No Muscle Exposed: No Joint Exposed: No Bone Exposed: No Treatment Notes Wound #1 (Toe Great) Wound Laterality: Plantar, Right Cleanser Peri-Wound Care Topical Primary Dressing Promogran Prisma Matrix, 4.34 (sq in) (silver collagen) Discharge Instruction: Moisten collagen with saline or hydrogel Secondary Dressing Woven Gauze Sponges 2x2 in Discharge Instruction: Apply over primary dressing as directed. Optifoam Non-Adhesive Dressing, 4x4 in Discharge Instruction: Apply over primary dressing  as directed. Secured With Conforming Stretch Gauze Bandage, Sterile 2x75 (in/in) Discharge Instruction: Secure with stretch gauze as directed. 60M Medipore Soft Cloth Surgical T 2x10 (in/yd) ape Discharge Instruction: Secure with tape as directed. Compression Wrap Compression Stockings Add-Ons Electronic Signature(s) Signed: 04/02/2021 11:32:21 AM By: Karie Schwalbe RN Entered By: Karie Schwalbe on 04/02/2021 08:10:10 -------------------------------------------------------------------------------- Vitals Details Patient Name: Date of Service: [address] [place] C. 04/02/2021 8:15 A M Medical Record Number: [id number] Patient Account Number: [id number] Date of Birth/Sex: Treating RN: 1955/07/24 (66 y.o. [name] [name] Primary Care Juri Dinning: [name] Other Clinician: Referring Rip Hawes: Treating Yuto Cajuste/Extender: [name] in Treatment: 1 Vital Signs Time Taken: 08:00 Temperature (F): 97.6 Height (in): 73 Pulse (bpm): 78 Weight (lbs): 203 Respiratory Rate (breaths/min): 18 Body Mass Index (BMI): 26.8 Blood Pressure (mmHg): 133/97 Reference Range: 80 - 120 mg / dl Electronic Signature(s) Signed: 04/02/2021 11:32:21 AM By: Karie Schwalbe RN Entered By: Karie Schwalbe on 04/02/2021 08:01:35

## 2021-04-10 ENCOUNTER — Other Ambulatory Visit: Payer: Self-pay

## 2021-04-10 ENCOUNTER — Encounter (HOSPITAL_BASED_OUTPATIENT_CLINIC_OR_DEPARTMENT_OTHER): Payer: Managed Care, Other (non HMO) | Attending: General Surgery | Admitting: General Surgery

## 2021-04-10 DIAGNOSIS — L97512 Non-pressure chronic ulcer of other part of right foot with fat layer exposed: Secondary | ICD-10-CM | POA: Diagnosis not present

## 2021-04-10 DIAGNOSIS — E1122 Type 2 diabetes mellitus with diabetic chronic kidney disease: Secondary | ICD-10-CM | POA: Insufficient documentation

## 2021-04-10 DIAGNOSIS — Z79899 Other long term (current) drug therapy: Secondary | ICD-10-CM | POA: Diagnosis not present

## 2021-04-10 DIAGNOSIS — M109 Gout, unspecified: Secondary | ICD-10-CM | POA: Insufficient documentation

## 2021-04-10 DIAGNOSIS — I129 Hypertensive chronic kidney disease with stage 1 through stage 4 chronic kidney disease, or unspecified chronic kidney disease: Secondary | ICD-10-CM | POA: Diagnosis not present

## 2021-04-10 DIAGNOSIS — E11621 Type 2 diabetes mellitus with foot ulcer: Secondary | ICD-10-CM | POA: Diagnosis present

## 2021-04-10 DIAGNOSIS — N183 Chronic kidney disease, stage 3 unspecified: Secondary | ICD-10-CM | POA: Diagnosis not present

## 2021-04-10 DIAGNOSIS — E1142 Type 2 diabetes mellitus with diabetic polyneuropathy: Secondary | ICD-10-CM | POA: Insufficient documentation

## 2021-04-13 NOTE — Progress Notes (Signed)
[name], [name] ([id number]) Visit Report for 04/10/2021 Arrival Information Details Patient Name: Date of Service: [name] 04/10/2021 8:15 A M Medical Record Number: [id number] Patient Account Number: [id number] Date of Birth/Sex: Treating RN: 1955-12-29 (66 y.o. M) Primary Care Sherman Donaldson: [name] [name] Other Clinician: Referring Vashon Arch: Treating Oneita Allmon/Extender: [name] in Treatment: 2 Visit Information History Since Last Visit Added or deleted any medications: No Patient Arrived: Ambulatory Any new allergies or adverse reactions: No Arrival Time: 08:09 Had a fall or experienced change in No Accompanied By: self activities of daily living that may affect Transfer Assistance: None risk of falls: Patient Identification Verified: Yes Signs or symptoms of abuse/neglect since last visito No Secondary Verification Process Completed: Yes Hospitalized since last visit: No Patient Requires Transmission-Based Precautions: No Implantable device outside of the clinic excluding No Patient Has Alerts: Yes cellular tissue based products placed in the center Patient Alerts: R ABI N/P since last visit: Has Dressing in Place as Prescribed: Yes Pain Present Now: No Electronic Signature(s) Signed: 04/10/2021 1:10:25 PM By: Karl Ito Entered By: Karl Ito on 04/10/2021 08:10:10 -------------------------------------------------------------------------------- Encounter Discharge Information Details Patient Name: Date of Service: [address] [place] C. 04/10/2021 8:15 A M Medical Record Number: [id number] Patient Account Number: [id number] Date of Birth/Sex: Treating RN: 14-Apr-1955 (66 y.o. [name] [name] Primary Care Lawarence Meek: [name] [name] Other Clinician: Referring Arlando Leisinger: Treating Donnae Michels/Extender: [name] in Treatment: 2 Encounter Discharge Information Items Post Procedure Vitals Discharge Condition:  Stable Temperature (F): 98.0 Ambulatory Status: Ambulatory Pulse (bpm): 78 Discharge Destination: Home Respiratory Rate (breaths/min): 18 Transportation: Private Auto Blood Pressure (mmHg): 180/81 Accompanied By: alone Schedule Follow-up Appointment: Yes Clinical Summary of Care: Patient Declined Electronic Signature(s) Signed: 04/13/2021 5:43:07 PM By: Zandra Abts RN, BSN Entered By: Zandra Abts on 04/10/2021 15:32:14 -------------------------------------------------------------------------------- Lower Extremity Assessment Details Patient Name: Date of Service: [address] [place] C. 04/10/2021 8:15 A M Medical Record Number: [id number] Patient Account Number: [id number] Date of Birth/Sex: Treating RN: 30-Dec-1955 (66 y.o. [name] [name] Primary Care [name]: [name] [name] Other Clinician: Referring Lilienne Weins: Treating [name] [name]/Extender: [name] in Treatment: 2 Edema Assessment Assessed: [name] [name]: No] Franne Forts: No] Edema: [Left: N] [Right: o] Calf Left: Right: Point of Measurement: From Medial Instep 41 cm Ankle Left: Right: Point of Measurement: From Medial Instep 24 cm Vascular Assessment Pulses: Dorsalis Pedis Palpable: [Right:Yes] Electronic Signature(s) Signed: 04/13/2021 5:43:07 PM By: Zandra Abts RN, BSN Entered By: Zandra Abts on 04/10/2021 08:19:49 -------------------------------------------------------------------------------- Multi Wound Chart Details Patient Name: Date of Service: [address] [place]. 04/10/2021 8:15 A M Medical Record Number: [id number] Patient Account Number: [id number] Date of Birth/Sex: Treating RN: 1955-03-17 (66 y.o. M) Primary Care [name]: [name] [name] Other Clinician: Referring Anaysha Andre: Treating [name] [name]/Extender: [name] in Treatment: 2 Vital Signs Height(in): 73 Pulse(bpm): 78 Weight(lbs): 203 Blood Pressure(mmHg): 180/81 Body Mass Index(BMI):  26.8 Temperature(F): 98.0 Respiratory Rate(breaths/min): 18 Photos: [1:Right, Plantar T Great oe] [N/A:N/A N/A] Wound Location: [1:Gradually Appeared] [N/A:N/A] Wounding Event: [1:Diabetic Wound/Ulcer of the Lower] [N/A:N/A] Primary Etiology: [1:Extremity Hypertension, Type II Diabetes, Gout, N/A] Comorbid History: [1:Neuropathy 03/05/2021] [N/A:N/A] Date Acquired: [1:2] [N/A:N/A] Weeks of Treatment: [1:Open] [N/A:N/A] Wound Status: [1:No] [N/A:N/A] Wound Recurrence: [1:0.3x0.3x0.2] [N/A:N/A] Measurements L x W x D (cm) [1:0.071] [N/A:N/A] A (cm) : rea [1:0.014] [N/A:N/A] Volume (cm) : [1:81.60%] [N/A:N/A] % Reduction in A [1:rea: 63.20%] [N/A:N/A] % Reduction in Volume: [1:Grade 1] [N/A:N/A] Classification: [1:Medium] [N/A:N/A]  Exudate A mount: [1:Serosanguineous] [N/A:N/A] Exudate Type: [1:red, brown] [N/A:N/A] Exudate Color: [1:Thickened] [N/A:N/A] Wound Margin: [1:Large (67-100%)] [N/A:N/A] Granulation A mount: [1:Red] [N/A:N/A] Granulation Quality: [1:Small (1-33%)] [N/A:N/A] Necrotic A mount: [1:Fat Layer (Subcutaneous Tissue): Yes N/A] Exposed Structures: [1:Fascia: No Tendon: No Muscle: No Joint: No Bone: No None] [N/A:N/A] Epithelialization: [1:Debridement - Selective/Open Wound N/A] Debridement: Pre-procedure Verification/Time Out 08:23 [N/A:N/A] Taken: [1:Callus] [N/A:N/A] Tissue Debrided: [1:Non-Viable Tissue] [N/A:N/A] Level: [1:0.09] [N/A:N/A] Debridement A (sq cm): [1:rea Curette] [N/A:N/A] Instrument: [1:Minimum] [N/A:N/A] Bleeding: [1:Pressure] [N/A:N/A] Hemostasis A chieved: [1:0] [N/A:N/A] Procedural Pain: [1:0] [N/A:N/A] Post Procedural Pain: [1:Procedure was tolerated well] [N/A:N/A] Debridement Treatment Response: [1:0.3x0.3x0.2] [N/A:N/A] Post Debridement Measurements L x W x D (cm) [1:0.014] [N/A:N/A] Post Debridement Volume: (cm) [1:Debridement] [N/A:N/A] Treatment Notes Electronic Signature(s) Signed: 04/10/2021 8:32:29 AM By: Duanne Guess MD FACS Entered By: Duanne Guess on 04/10/2021 08:32:29 -------------------------------------------------------------------------------- Multi-Disciplinary Care Plan Details Patient Name: Date of Service: [address] [place]. 04/10/2021 8:15 A M Medical Record Number: [id number] Patient Account Number: [id number] Date of Birth/Sex: Treating RN: 08/18/1955 (66 y.o. [name] [name] Primary Care Karleigh Bunte: [name] [name] Other Clinician: Referring Anae Hams: Treating Greydis Stlouis/Extender: [name] in Treatment: 2 Multidisciplinary Care Plan reviewed with physician Active Inactive Nutrition Nursing Diagnoses: Impaired glucose control: actual or potential Potential for alteratiion in Nutrition/Potential for imbalanced nutrition Goals: Patient/caregiver will maintain therapeutic glucose control Date Initiated: 03/26/2021 Target Resolution Date: 04/23/2021 Goal Status: Active Interventions: Assess patient nutrition upon admission and as needed per policy Provide education on elevated blood sugars and impact on wound healing Treatment Activities: Patient referred to Primary Care Physician for further nutritional evaluation : 03/26/2021 Notes: Wound/Skin Impairment Nursing Diagnoses: Impaired tissue integrity Knowledge deficit related to ulceration/compromised skin integrity Goals: Patient/caregiver will verbalize understanding of skin care regimen Date Initiated: 03/26/2021 Target Resolution Date: 04/23/2021 Goal Status: Active Ulcer/skin breakdown will have a volume reduction of 30% by week 4 Date Initiated: 03/26/2021 Target Resolution Date: 04/23/2021 Goal Status: Active Interventions: Assess patient/caregiver ability to obtain necessary supplies Assess patient/caregiver ability to perform ulcer/skin care regimen upon admission and as needed Assess ulceration(s) every visit Provide education on ulcer and skin care Treatment Activities: Skin care  regimen initiated : 03/26/2021 Topical wound management initiated : 03/26/2021 Notes: Electronic Signature(s) Signed: 04/13/2021 5:43:07 PM By: Zandra Abts RN, BSN Entered By: Zandra Abts on 04/10/2021 08:24:49 -------------------------------------------------------------------------------- Pain Assessment Details Patient Name: Date of Service: [address] [facility] C. 04/10/2021 8:15 A M Medical Record Number: [id number] Patient Account Number: [id number] Date of Birth/Sex: Treating RN: May 08, 1955 (66 y.o. M) Primary Care Jenessa Gillingham: [name] [name] Other Clinician: Referring Nataki Mccrumb: Treating Lukus Binion/Extender: [name] in Treatment: 2 Active Problems Location of Pain Severity and Description of Pain Patient Has Paino No Site Locations Pain Management and Medication Current Pain Management: Electronic Signature(s) Signed: 04/10/2021 1:10:25 PM By: Karl Ito Entered By: Karl Ito on 04/10/2021 08:10:37 -------------------------------------------------------------------------------- Patient/Caregiver Education Details Patient Name: Date of Service: Deretha [place] 3/3/2023andnbsp8:15 A M Medical Record Number: [id number] Patient Account Number: [id number] Date of Birth/Gender: Treating RN: Dec 07, 1955 (66 y.o. [name] [name] Primary Care Physician: [name] [name] Other Clinician: Referring Physician: Treating Physician/Extender: [name] in Treatment: 2 Education Assessment Education Provided To: Patient Education Topics Provided Wound/Skin Impairment: Methods: Explain/Verbal Responses: State content correctly Electronic Signature(s) Signed: 04/13/2021 5:43:07 PM By: Zandra Abts RN, BSN Entered By: Zandra Abts on 04/10/2021 08:25:01 -------------------------------------------------------------------------------- Wound Assessment Details Patient Name: Date of Service:  [address] [place] C. 04/10/2021  8:15 A M Medical Record Number: [id number] Patient Account Number: [id number] Date of Birth/Sex: Treating RN: 08-May-1955 (66 y.o. M) Primary Care Lamiah Marmol: [name] [name] Other Clinician: Referring Nollie Terlizzi: Treating Ceyda Peterka/Extender: [name] in Treatment: 2 Wound Status Wound Number: 1 Primary Etiology: Diabetic Wound/Ulcer of the Lower Extremity Wound Location: Right, Plantar T Great oe Wound Status: Open Wounding Event: Gradually Appeared Comorbid History: Hypertension, Type II Diabetes, Gout, Neuropathy Date Acquired: 03/05/2021 Weeks Of Treatment: 2 Clustered Wound: No Photos Wound Measurements Length: (cm) 0.3 Width: (cm) 0.3 Depth: (cm) 0.2 Area: (cm) 0.071 Volume: (cm) 0.014 % Reduction in Area: 81.6% % Reduction in Volume: 63.2% Epithelialization: None Tunneling: No Undermining: No Wound Description Classification: Grade 1 Wound Margin: Thickened Exudate Amount: Medium Exudate Type: Serosanguineous Exudate Color: red, brown Foul Odor After Cleansing: No Slough/Fibrino Yes Wound Bed Granulation Amount: Large (67-100%) Exposed Structure Granulation Quality: Red Fascia Exposed: No Necrotic Amount: Small (1-33%) Fat Layer (Subcutaneous Tissue) Exposed: Yes Necrotic Quality: Adherent Slough Tendon Exposed: No Muscle Exposed: No Joint Exposed: No Bone Exposed: No Treatment Notes Wound #1 (Toe Great) Wound Laterality: Plantar, Right Cleanser Soap and Water Discharge Instruction: May shower and wash wound with dial antibacterial soap and water prior to dressing change. Wound Cleanser Discharge Instruction: Cleanse the wound with wound cleanser prior to applying a clean dressing using gauze sponges, not tissue or cotton balls. Peri-Wound Care Topical Primary Dressing Promogran Prisma Matrix, 4.34 (sq in) (silver collagen) Discharge Instruction: Moisten collagen with saline or hydrogel Secondary Dressing Woven Gauze Sponges  2x2 in Discharge Instruction: Apply over primary dressing as directed. Optifoam Non-Adhesive Dressing, 4x4 in Discharge Instruction: Apply over primary dressing as directed. Secured With Conforming Stretch Gauze Bandage, Sterile 2x75 (in/in) Discharge Instruction: Secure with stretch gauze as directed. 52M Medipore Soft Cloth Surgical T 2x10 (in/yd) ape Discharge Instruction: Secure with tape as directed. Compression Wrap Compression Stockings Add-Ons Electronic Signature(s) Signed: 04/13/2021 5:43:07 PM By: Zandra Abts RN, BSN Entered By: Zandra Abts on 04/10/2021 08:20:06 -------------------------------------------------------------------------------- Vitals Details Patient Name: Date of Service: [address] [place]. 04/10/2021 8:15 A M Medical Record Number: [id number] Patient Account Number: [id number] Date of Birth/Sex: Treating RN: [birth year] (66 y.o. M) Primary Care Deforrest Bogle: [name] [name] Other Clinician: Referring Kayla Weekes: Treating Tykiera Raven/Extender: [name] in Treatment: 2 Vital Signs Time Taken: 08:10 Temperature (F): 98.0 Height (in): 73 Pulse (bpm): 78 Weight (lbs): 203 Respiratory Rate (breaths/min): 18 Body Mass Index (BMI): 26.8 Blood Pressure (mmHg): 180/81 Reference Range: 80 - 120 mg / dl Electronic Signature(s) Signed: 04/10/2021 1:10:25 PM By: Karl Ito Entered By: Karl Ito on 04/10/2021 08:10:29

## 2021-04-13 NOTE — Progress Notes (Signed)
JOVI, ZAVADIL ([id number]) Visit Report for 04/10/2021 Chief Complaint Document Details Patient Name: Date of Service: [name] [name]. 04/10/2021 8:15 A M Medical Record Number: [id number] Patient Account Number: [id number] Date of Birth/Sex: Treating RN: 1955-07-25 (66 y.o. M) Primary Care Provider: [name] Other Clinician: Referring Provider: Treating Provider/Extender: [name] in Treatment: 2 Information Obtained from: Patient Chief Complaint 04/10/2021: patient is here for review of a wound on his plantar right first toe Electronic Signature(s) Signed: 04/10/2021 8:32:43 AM By: Duanne Guess MD FACS Entered By: Duanne Guess on 04/10/2021 08:32:43 -------------------------------------------------------------------------------- Debridement Details Patient Name: Date of Service: [address] [name]. 04/10/2021 8:15 A M Medical Record Number: [id number] Patient Account Number: [id number] Date of Birth/Sex: Treating RN: [birth year] (66 y.o. [name] Primary Care Provider: [name] Other Clinician: Referring Provider: Treating Provider/Extender: [name] in Treatment: 2 Debridement Performed for Assessment: Wound #1 Right,Plantar T Great oe Performed By: Physician Duanne Guess, MD Debridement Type: Debridement Severity of Tissue Pre Debridement: Fat layer exposed Level of Consciousness (Pre-procedure): Awake and Alert Pre-procedure Verification/Time Out Yes - 08:23 Taken: Start Time: 08:23 T Area Debrided (L x W): otal 0.3 (cm) x 0.3 (cm) = 0.09 (cm) Tissue and other material debrided: Non-Viable, Callus Level: Non-Viable Tissue Debridement Description: Selective/Open Wound Instrument: Curette Bleeding: Minimum Hemostasis Achieved: Pressure End Time: 08:25 Procedural Pain: 0 Post Procedural Pain: 0 Response to Treatment: Procedure was tolerated well Level of Consciousness (Post- Awake and  Alert procedure): Post Debridement Measurements of Total Wound Length: (cm) 0.3 Width: (cm) 0.3 Depth: (cm) 0.2 Volume: (cm) 0.014 Character of Wound/Ulcer Post Debridement: Improved Severity of Tissue Post Debridement: Fat layer exposed Post Procedure Diagnosis Same as Pre-procedure Electronic Signature(s) Signed: 04/10/2021 8:36:40 AM By: Duanne Guess MD FACS Signed: 04/13/2021 5:43:07 PM By: Zandra Abts RN, BSN Entered By: Zandra Abts on 04/10/2021 08:24:36 -------------------------------------------------------------------------------- HPI Details Patient Name: Date of Service: [address] [name]. 04/10/2021 8:15 A M Medical Record Number: [id number] Patient Account Number: [id number] Date of Birth/Sex: Treating RN: 1955-07-16 (66 y.o. M) Primary Care Provider: [name] Other Clinician: Referring Provider: Treating Provider/Extender: [name] in Treatment: 2 History of Present Illness HPI Description: ADMISSION 03/26/2021 This is a [age]-year-old man with type 2 diabetes and peripheral polyneuropathy. He noticed a wound on his distal phalanx of his right great toe several months ago. This did not make any progress after several visits to see Dr. 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 This is healed nicely however he developed a subsequent blister on the proximal phalanx remanent and has developed an open wound here and he is here for that reason. He has been using Santyl. Our intake nurse noted slight undermining. Dr. Victorino Dike gave him a Darco forefoot offloading shoe although he could not wear this. He is now back into usual foot wear at work and he was wearing crocs although he figured this did not do the wound any good. Past medical history includes type 2 diabetes with peripheral neuropathy, Barrett's esophagus, stage III chronic renal failure, gout he takes Lasix as needed ABI in our  clinic clinic was noncompressible 04/02/2021: The patient has been compliant with offloading. He feels like the wound is improving, although he admits that it is difficult for him to adequately visualize it on the bottom of his toe. He has been wearing his surgical shoe  and is currently in silver collagen with an OPTi foam donut surrounding the site. There is some callus buildup around the edges of the wound, but overall the longitudinal measurement and undermining have decreased. 04/10/2021: He continues to be compliant with his offloading. The wound is smaller again today. He continues to have callus buildup around the edges of the wound, but has been wearing his surgical shoe with silver collagen and an OPTi foam doughnut. Electronic Signature(s) Signed: 04/10/2021 8:33:40 AM By: Duanne Guess MD FACS Entered By: Duanne Guess on 04/10/2021 08:33:40 -------------------------------------------------------------------------------- Physical Exam Details Patient Name: Date of Service: [address] [place] [name]. 04/10/2021 8:15 A M Medical Record Number: [id number] Patient Account Number: [id number] Date of Birth/Sex: Treating RN: 17-Sep-1955 (66 y.o. M) Primary Care Provider: [name] Other Clinician: Referring Provider: Treating Provider/Extender: [name] in Treatment: 2 Constitutional He is hypertensive. . . . No acute distress. Respiratory Normal work of breathing on room air. Notes 04/10/2021: Wound examon the plantar surface of the right great toe, the small circular wound is still present, but smaller today. Minimal undermining. Good granulation tissue on the wound surface. Electronic Signature(s) Signed: 04/10/2021 8:35:05 AM By: Duanne Guess MD FACS Entered By: Duanne Guess on 04/10/2021 08:35:05 -------------------------------------------------------------------------------- Physician Orders Details Patient Name: Date of Service: [address] [place] [name].  04/10/2021 8:15 A M Medical Record Number: [id number] Patient Account Number: [id number] Date of Birth/Sex: Treating RN: 1955-10-20 (66 y.o. [name] Primary Care Provider: [name] Other Clinician: Referring Provider: Treating Provider/Extender: [name] in Treatment: 2 Verbal / Phone Orders: No Diagnosis Coding ICD-10 Coding Code Description E11.621 Type 2 diabetes mellitus with foot ulcer L97.518 Non-pressure chronic ulcer of other part of right foot with other specified severity E11.42 Type 2 diabetes mellitus with diabetic polyneuropathy Follow-up Appointments ppointment in 1 week. - Monday 3/13 with Dr. Lady Gary Return A Bathing/ Shower/ Hygiene May shower with protection but do not get wound dressing(s) wet. Off-Loading Wound #1 Right,Plantar T Great oe Open toe surgical shoe to: - right foot Wound Treatment Wound #1 - T Great oe Wound Laterality: Plantar, Right Cleanser: Soap and Water Every Other Day/30 Days Discharge Instructions: May shower and wash wound with dial antibacterial soap and water prior to dressing change. Cleanser: Wound Cleanser Every Other Day/30 Days Discharge Instructions: Cleanse the wound with wound cleanser prior to applying a clean dressing using gauze sponges, not tissue or cotton balls. Prim Dressing: Promogran Prisma Matrix, 4.34 (sq in) (silver collagen) (Dispense As Written) Every Other Day/30 Days ary Discharge Instructions: Moisten collagen with saline or hydrogel Secondary Dressing: Woven Gauze Sponges 2x2 in Every Other Day/30 Days Discharge Instructions: Apply over primary dressing as directed. Secondary Dressing: Optifoam Non-Adhesive Dressing, 4x4 in (Generic) Every Other Day/30 Days Discharge Instructions: Apply over primary dressing as directed. Secured With: Insurance underwriter, Sterile 2x75 (in/in) (Generic) Every Other Day/30 Days Discharge Instructions: Secure with stretch  gauze as directed. Secured With: 53M [employer] Scientist, research (life sciences) Surgical T 2x10 (in/yd) Every Other Day/30 Days ape Discharge Instructions: Secure with tape as directed. Electronic Signature(s) Signed: 04/10/2021 8:36:40 AM By: Duanne Guess MD FACS Entered By: Duanne Guess on 04/10/2021 08:35:20 -------------------------------------------------------------------------------- Problem List Details Patient Name: Date of Service: [address] [place] [name]. 04/10/2021 8:15 A M Medical Record Number: [id number] Patient Account Number: [id number] Date of Birth/Sex: Treating RN: 1955-09-29 (66 y.o. [name] Primary Care Provider: [name] Other Clinician: Referring Provider: Treating  Provider/Extender: Lacretia Leighannon, Fergie Sherbert Shaw, [name] in Treatment: 2 Active Problems ICD-10 Encounter Code Description Active Date MDM Diagnosis E11.621 Type 2 diabetes mellitus with foot ulcer 03/26/2021 No Yes L97.518 Non-pressure chronic ulcer of other part of right foot with other specified 03/26/2021 No Yes severity E11.42 Type 2 diabetes mellitus with diabetic polyneuropathy 03/26/2021 No Yes Inactive Problems Resolved Problems Electronic Signature(s) Signed: 04/10/2021 8:32:11 AM By: Duanne Guessannon, Camreigh Michie MD FACS Entered By: Duanne Guessannon, Amaris Garrette on 04/10/2021 08:32:11 -------------------------------------------------------------------------------- Progress Note Details Patient Name: Date of Service: [address], [name]. 04/10/2021 8:15 A M Medical Record Number: [id number] Patient Account Number: [id number] Date of Birth/Sex: Treating RN: 11/06/1955 (66 y.o. M) Primary Care Provider: Martha ClanShaw, William Other Clinician: Referring Provider: Treating Provider/Extender: Lacretia Leighannon, Zak Gondek Shaw, [name] in Treatment: 2 Subjective Chief Complaint Information obtained from Patient 04/10/2021: patient is here for review of a wound on his plantar right first toe History of Present Illness  (HPI) ADMISSION 03/26/2021 This is a [age]-year-old man with type 2 diabetes and peripheral polyneuropathy. He noticed a wound on his distal phalanx of his right great toe several months ago. This did not make any progress after several visits to see Dr. 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 This is healed nicely however he developed a subsequent blister on the proximal phalanx remanent and has developed an open wound here and he is here for that reason. He has been using Santyl. Our intake nurse noted slight undermining. Dr. Victorino DikeHewitt gave him a Darco forefoot offloading shoe although he could not wear this. He is now back into usual foot wear at work and he was wearing crocs although he figured this did not do the wound any good. Past medical history includes type 2 diabetes with peripheral neuropathy, Barrett's esophagus, stage III chronic renal failure, gout he takes Lasix as needed ABI in our clinic clinic was noncompressible 04/02/2021: The patient has been compliant with offloading. He feels like the wound is improving, although he admits that it is difficult for him to adequately visualize it on the bottom of his toe. He has been wearing his surgical shoe and is currently in silver collagen with an OPTi foam donut surrounding the site. There is some callus buildup around the edges of the wound, but overall the longitudinal measurement and undermining have decreased. 04/10/2021: He continues to be compliant with his offloading. The wound is smaller again today. He continues to have callus buildup around the edges of the wound, but has been wearing his surgical shoe with silver collagen and an OPTi foam doughnut. Patient History Information obtained from Patient, Chart. Family History Cancer - Father, Diabetes - Siblings,Father,Child, Heart Disease - Mother, No family history of Hereditary Spherocytosis, Hypertension, Kidney Disease, Lung  Disease, Seizures, Stroke, Thyroid Problems, Tuberculosis. Social History Never smoker, Marital Status - Married, Alcohol Use - Never, Drug Use - No History, Caffeine Use - Daily. Medical History Eyes Denies history of Cataracts, Glaucoma, Optic Neuritis Ear/Nose/Mouth/Throat Denies history of Chronic sinus problems/congestion, Middle ear problems Cardiovascular Patient has history of Hypertension Endocrine Patient has history of Type II Diabetes Genitourinary Denies history of End Stage Renal Disease Integumentary (Skin) Denies history of History of Burn Musculoskeletal Patient has history of Gout Denies history of Rheumatoid Arthritis, Osteoarthritis, Osteomyelitis Neurologic Patient has history of Neuropathy Denies history of Dementia, Quadriplegia, Paraplegia, Seizure Disorder Oncologic Denies history of Received Chemotherapy, Received Radiation Psychiatric Denies history of Anorexia/bulimia, Confinement Anxiety Hospitalization/Surgery History -  rigth hallux amputation. - meniscus tears repairs. Medical A Surgical History Notes nd Cardiovascular Mitral valve prolapse Genitourinary CKD stage 3, barrett's esophagus Objective Constitutional He is hypertensive. No acute distress. Vitals Time Taken: 8:10 AM, Height: 73 in, Weight: 203 lbs, BMI: 26.8, Temperature: 98.0 F, Pulse: 78 bpm, Respiratory Rate: 18 breaths/min, Blood Pressure: 180/81 mmHg. Respiratory Normal work of breathing on room air. General Notes: 04/10/2021: Wound examooon the plantar surface of the right great toe, the small circular wound is still present, but smaller today. Minimal undermining. Good granulation tissue on the wound surface. Integumentary (Hair, Skin) Wound #1 status is Open. Original cause of wound was Gradually Appeared. The date acquired was: 03/05/2021. The wound has been in treatment 2 weeks. The wound is located on the [employer]. The wound measures 0.3cm length x 0.3cm  width x 0.2cm depth; 0.071cm^2 area and 0.014cm^3 volume. oe There is Fat Layer (Subcutaneous Tissue) exposed. There is no tunneling or undermining noted. There is a medium amount of serosanguineous drainage noted. The wound margin is thickened. There is large (67-100%) red granulation within the wound bed. There is a small (1-33%) amount of necrotic tissue within the wound bed including Adherent Slough. Assessment Active Problems ICD-10 Type 2 diabetes mellitus with foot ulcer Non-pressure chronic ulcer of other part of right foot with other specified severity Type 2 diabetes mellitus with diabetic polyneuropathy Procedures Wound #1 Pre-procedure diagnosis of Wound #1 is a Diabetic Wound/Ulcer of the Lower Extremity located on the Right,Plantar T Great .Severity of Tissue Pre oe Debridement is: Fat layer exposed. There was a Selective/Open Wound Non-Viable Tissue Debridement with a total area of 0.09 sq cm performed by Duanne Guess, MD. With the following instrument(s): Curette to remove Non-Viable tissue/material. Material removed includes Callus. No specimens were taken. A time out was conducted at 08:23, prior to the start of the procedure. A Minimum amount of bleeding was controlled with Pressure. The procedure was tolerated well with a pain level of 0 throughout and a pain level of 0 following the procedure. Post Debridement Measurements: 0.3cm length x 0.3cm width x 0.2cm depth; 0.014cm^3 volume. Character of Wound/Ulcer Post Debridement is improved. Severity of Tissue Post Debridement is: Fat layer exposed. Post procedure Diagnosis Wound #1: Same as Pre-Procedure Plan Follow-up Appointments: Return Appointment in 1 week. - Monday 3/13 with Dr. Lady Gary Bathing/ Shower/ Hygiene: May shower with protection but do not get wound dressing(s) wet. Off-Loading: Wound #1 Right,Plantar T Great: oe Open toe surgical shoe to: - right foot WOUND #1: - T Great Wound Laterality: Plantar,  Right oe Cleanser: Soap and Water Every Other Day/30 Days Discharge Instructions: May shower and wash wound with dial antibacterial soap and water prior to dressing change. Cleanser: Wound Cleanser Every Other Day/30 Days Discharge Instructions: Cleanse the wound with wound cleanser prior to applying a clean dressing using gauze sponges, not tissue or cotton balls. Prim Dressing: Promogran Prisma Matrix, 4.34 (sq in) (silver collagen) (Dispense As Written) Every Other Day/30 Days ary Discharge Instructions: Moisten collagen with saline or hydrogel Secondary Dressing: Woven Gauze Sponges 2x2 in Every Other Day/30 Days Discharge Instructions: Apply over primary dressing as directed. Secondary Dressing: Optifoam Non-Adhesive Dressing, 4x4 in (Generic) Every Other Day/30 Days Discharge Instructions: Apply over primary dressing as directed. Secured With: Insurance underwriter, Sterile 2x75 (in/in) (Generic) Every Other Day/30 Days Discharge Instructions: Secure with stretch gauze as directed. Secured With: 33M [employer] Scientist, research (life sciences) Surgical T 2x10 (in/yd) Every Other Day/30 Days ape Discharge  Instructions: Secure with tape as directed. 04/10/2021: Continued improvement with Prisma and foam doughnut. He will continue to offload as instructed. He will be out of town next Friday so I will see him the following Monday. Periwound callus was debrided today with a #5 curette. Electronic Signature(s) Signed: 04/10/2021 8:36:13 AM By: Duanne Guessannon, Tavious Griesinger MD FACS Entered By: Duanne Guessannon, Akiko Schexnider on 04/10/2021 08:36:12 -------------------------------------------------------------------------------- HxROS Details Patient Name: [name]. 04/10/2021 8:15 A M Medical Record Number: [id number] Patient Account Number: [id number] Date of Birth/Sex: Treating RN: 04/09/1955 (66 y.o. M) Primary Care Provider: Martha ClanShaw, William Other Clinician: Referring Provider: Treating Provider/Extender:  Lacretia Leighannon, Pharrah Rottman Shaw, [name] in Treatment: 2 Information Obtained From Patient Chart Eyes Medical History: Negative for: Cataracts; Glaucoma; Optic Neuritis Ear/Nose/Mouth/Throat Medical History: Negative for: Chronic sinus problems/congestion; Middle ear problems Cardiovascular Medical History: Positive for: Hypertension Past Medical History Notes: Mitral valve prolapse Endocrine Medical History: Positive for: Type II Diabetes Time with diabetes: since 2010 Treated with: Insulin Blood sugar tested every day: No Genitourinary Medical History: Negative for: End Stage Renal Disease Past Medical History Notes: CKD stage 3, barrett's esophagus Integumentary (Skin) Medical History: Negative for: History of Burn Musculoskeletal Medical History: Positive for: Gout Negative for: Rheumatoid Arthritis; Osteoarthritis; Osteomyelitis Neurologic Medical History: Positive for: Neuropathy Negative for: Dementia; Quadriplegia; Paraplegia; Seizure Disorder Oncologic Medical History: Negative for: Received Chemotherapy; Received Radiation Psychiatric Medical History: Negative for: Anorexia/bulimia; Confinement Anxiety Immunizations Pneumococcal Vaccine: Received Pneumococcal Vaccination: No Implantable Devices None Hospitalization / Surgery History Type of Hospitalization/Surgery rigth hallux amputation meniscus tears repairs Family and Social History Cancer: Yes - Father; Diabetes: Yes - Siblings,Father,Child; Heart Disease: Yes - Mother; Hereditary Spherocytosis: No; Hypertension: No; Kidney Disease: No; Lung Disease: No; Seizures: No; Stroke: No; Thyroid Problems: No; Tuberculosis: No; Never smoker; Marital Status - Married; Alcohol Use: Never; Drug Use: No History; Caffeine Use: Daily; Financial Concerns: No; Food, Clothing or Shelter Needs: No; Support System Lacking: No; Transportation Concerns: No Physician Affirmation I have reviewed and agree with the above  information. Electronic Signature(s) Signed: 04/10/2021 8:36:40 AM By: Duanne Guessannon, Kamille Toomey MD FACS Entered By: Duanne Guessannon, Alyus Mofield on 04/10/2021 08:34:12 -------------------------------------------------------------------------------- SuperBill Details Patient Name: Date of Service: [name]. 04/10/2021 Medical Record Number: [id number] Patient Account Number: [id number] Date of Birth/Sex: Treating RN: 02/11/1955 (66 y.o. M) Primary Care Provider: Martha ClanShaw, William Other Clinician: Referring Provider: Treating Provider/Extender: Lacretia Leighannon, Kent Riendeau Shaw, [name] in Treatment: 2 Diagnosis Coding ICD-10 Codes Code Description [phone] Type 2 diabetes mellitus with foot ulcer L97.518 Non-pressure chronic ulcer of other part of right foot with other specified severity E11.42 Type 2 diabetes mellitus with diabetic polyneuropathy Facility Procedures CPT4 Code: [id number] Description: [phone] - DEBRIDE WOUND 1ST 20 SQ CM OR < ICD-10 Diagnosis Description L97.518 Non-pressure chronic ulcer of other part of right foot with other specified sever Modifier: ity Quantity: 1 Physician Procedures : CPT4 Code Description Modifier [id number] - WC PHYS DEBR WO ANESTH 20 SQ CM ICD-10 Diagnosis Description L97.518 Non-pressure chronic ulcer of other part of right foot with other specified severity Quantity: 1 Electronic Signature(s) Signed: 04/10/2021 8:36:24 AM By: Duanne Guessannon, Disaya Walt MD FACS Entered By: Duanne Guessannon, Jveon Pound on 04/10/2021 08:36:23

## 2021-04-20 ENCOUNTER — Encounter (HOSPITAL_BASED_OUTPATIENT_CLINIC_OR_DEPARTMENT_OTHER): Payer: Managed Care, Other (non HMO) | Admitting: General Surgery

## 2021-04-20 ENCOUNTER — Other Ambulatory Visit: Payer: Self-pay

## 2021-04-20 DIAGNOSIS — E11621 Type 2 diabetes mellitus with foot ulcer: Secondary | ICD-10-CM | POA: Diagnosis not present

## 2021-04-20 NOTE — Progress Notes (Signed)
HANAD, LEINO ([id number]) Visit Report for 04/20/2021 Arrival Information Details Patient Name: Date of Service: [place] C. 04/20/2021 8:15 A M Medical Record Number: [id number] Patient Account Number: [id number] Date of Birth/Sex: Treating RN: 19-Sep-1955 (65 y.o. M) Primary Care Kennette Cuthrell: Marton Redwood Other Clinician: Referring Deaisha Welborn: Treating Draper Gallon/Extender: Bethena Roys in Treatment: 3 Visit Information History Since Last Visit Added or deleted any medications: No Patient Arrived: Ambulatory Any new allergies or adverse reactions: No Arrival Time: 08:08 Had a fall or experienced change in No Accompanied By: self activities of daily living that may affect Transfer Assistance: None risk of falls: Patient Identification Verified: Yes Signs or symptoms of abuse/neglect since last visito No Secondary Verification Process Completed: Yes Hospitalized since last visit: No Patient Requires Transmission-Based Precautions: No Implantable device outside of the clinic excluding No Patient Has Alerts: Yes cellular tissue based products placed in the center Patient Alerts: R ABI N/P since last visit: Has Dressing in Place as Prescribed: Yes Pain Present Now: No Electronic Signature(s) Signed: 04/20/2021 8:20:16 AM By: Sandre Kitty Entered By: Sandre Kitty on 04/20/2021 08:08:46 -------------------------------------------------------------------------------- Encounter Discharge Information Details Patient Name: Date of Service: [address] [facility] C. 04/20/2021 8:15 A M Medical Record Number: [id number] Patient Account Number: [id number] Date of Birth/Sex: Treating RN: 12-07-1955 (66 y.o. Collene Gobble Primary Care Yuan Gann: Marton Redwood Other Clinician: Referring Ramey Ketcherside: Treating Vaneta Hammontree/Extender: Bethena Roys in Treatment: 3 Encounter Discharge Information Items Post Procedure Vitals Ambulatory Status:  Ambulatory Temperature (F): 98.4 Discharge Destination: Home Pulse (bpm): 94 Transportation: Private Auto Respiratory Rate (breaths/min): 18 Accompanied By: self Blood Pressure (mmHg): 173/94 Schedule Follow-up Appointment: Yes Clinical Summary of Care: Patient Declined Electronic Signature(s) Signed: 04/20/2021 9:05:35 AM By: Dellie Catholic RN Entered By: Dellie Catholic on 04/20/2021 09:05:08 -------------------------------------------------------------------------------- Lower Extremity Assessment Details Patient Name: Date of Service: [address] [place] C. 04/20/2021 8:15 A M Medical Record Number: [id number] Patient Account Number: [id number] Date of Birth/Sex: Treating RN: 05/04/1955 (66 y.o. Marcheta Grammes Primary Care Sherri Levenhagen: Marton Redwood Other Clinician: Referring Geremy Rister: Treating Jillianna Stanek/Extender: Bethena Roys in Treatment: 3 Edema Assessment Assessed: Shirlyn Goltz: No] Patrice Paradise: Yes] Edema: [Left: N] [Right: o] Calf Left: Right: Point of Measurement: From Medial Instep 40.5 cm Ankle Left: Right: Point of Measurement: From Medial Instep 24 cm Vascular Assessment Pulses: Dorsalis Pedis Palpable: [Right:Yes] Electronic Signature(s) Signed: 04/20/2021 5:18:59 PM By: Lorrin Jackson Entered By: Lorrin Jackson on 04/20/2021 08:12:08 -------------------------------------------------------------------------------- Multi Wound Chart Details Patient Name: Date of Service: [address] [facility] C. 04/20/2021 8:15 A M Medical Record Number: [id number] Patient Account Number: [id number] Date of Birth/Sex: Treating RN: Jun 26, 1955 (66 y.o. M) Primary Care Mickie Kozikowski: Marton Redwood Other Clinician: Referring Nyelle Wolfson: Treating Tilly Pernice/Extender: Bethena Roys in Treatment: 3 Vital Signs Height(in): 73 Capillary Blood Glucose(mg/dl): 176 Weight(lbs): 203 Pulse(bpm): 64 Body Mass Index(BMI): 26.8 Blood Pressure(mmHg):  173/94 Temperature(F): 98.4 Respiratory Rate(breaths/min): 18 Photos: [1:Right, Plantar T Great oe] [N/A:N/A N/A] Wound Location: [1:Gradually Appeared] [N/A:N/A] Wounding Event: [1:Diabetic Wound/Ulcer of the Lower] [N/A:N/A] Primary Etiology: [1:Extremity Hypertension, Type II Diabetes, Gout, N/A] Comorbid History: [1:Neuropathy 03/05/2021] [N/A:N/A] Date Acquired: [1:3] [N/A:N/A] Weeks of Treatment: [1:Open] [N/A:N/A] Wound Status: [1:No] [N/A:N/A] Wound Recurrence: [1:0.4x0.3x0.3] [N/A:N/A] Measurements L x W x D (cm) [1:0.094] [N/A:N/A] A (cm) : rea [1:0.028] [N/A:N/A] Volume (cm) : [1:75.60%] [N/A:N/A] % Reduction in A [1:rea: 26.30%] [N/A:N/A] % Reduction in Volume: [1:Grade 1] [N/A:N/A] Classification: [1:Medium] [N/A:N/A] Exudate A  mount: [1:Serosanguineous] [N/A:N/A] Exudate Type: [1:red, brown] [N/A:N/A] Exudate Color: [1:Distinct, outline attached] [N/A:N/A] Wound Margin: [1:Large (67-100%)] [N/A:N/A] Granulation A mount: [1:Red] [N/A:N/A] Granulation Quality: [1:Small (1-33%)] [N/A:N/A] Necrotic A mount: [1:Fat Layer (Subcutaneous Tissue): Yes N/A] Exposed Structures: [1:Fascia: No Tendon: No Muscle: No Joint: No Bone: No Medium (34-66%)] [N/A:N/A] Epithelialization: [1:Debridement - Excisional] [N/A:N/A] Debridement: Pre-procedure Verification/Time Out 08:45 [N/A:N/A] Taken: [1:Other] [N/A:N/A] Pain Control: [1:Callus, Subcutaneous, Slough] [N/A:N/A] Tissue Debrided: [1:Skin/Subcutaneous Tissue] [N/A:N/A] Level: [1:0.12] [N/A:N/A] Debridement A (sq cm): [1:rea Curette] [N/A:N/A] Instrument: [1:Minimum] [N/A:N/A] Bleeding: [1:Pressure] [N/A:N/A] Hemostasis A chieved: [1:0] [N/A:N/A] Procedural Pain: [1:0] [N/A:N/A] Post Procedural Pain: [1:Procedure was tolerated well] [N/A:N/A] Debridement Treatment Response: [1:0.4x0.3x0.3] [N/A:N/A] Post Debridement Measurements L x W x D (cm) [1:0.028] [N/A:N/A] Post Debridement Volume: (cm) [1:Debridement]  [N/A:N/A] Treatment Notes Electronic Signature(s) Signed: 04/20/2021 8:48:07 AM By: Fredirick Maudlin MD FACS Entered By: Fredirick Maudlin on 04/20/2021 08:48:07 -------------------------------------------------------------------------------- Multi-Disciplinary Care Plan Details Patient Name: Date of Service: [address] [facility] C. 04/20/2021 8:15 A M Medical Record Number: [id number] Patient Account Number: [id number] Date of Birth/Sex: Treating RN: 1955-09-10 (66 y.o. Marcheta Grammes Primary Care Jill Stopka: Marton Redwood Other Clinician: Referring Aoi Kouns: Treating Clinton Wahlberg/Extender: Bethena Roys in Treatment: 3 Multidisciplinary Care Plan reviewed with physician Active Inactive Nutrition Nursing Diagnoses: Impaired glucose control: actual or potential Potential for alteratiion in Nutrition/Potential for imbalanced nutrition Goals: Patient/caregiver will maintain therapeutic glucose control Date Initiated: 03/26/2021 Target Resolution Date: 05/25/2021 Goal Status: Active Interventions: Assess patient nutrition upon admission and as needed per policy Provide education on elevated blood sugars and impact on wound healing Treatment Activities: Patient referred to Primary Care Physician for further nutritional evaluation : 03/26/2021 Notes: 04/20/21: Glucose control ongoing Wound/Skin Impairment Nursing Diagnoses: Impaired tissue integrity Knowledge deficit related to ulceration/compromised skin integrity Goals: Patient/caregiver will verbalize understanding of skin care regimen Date Initiated: 03/26/2021 Target Resolution Date: 05/25/2021 Goal Status: Active Ulcer/skin breakdown will have a volume reduction of 30% by week 4 Date Initiated: 03/26/2021 Date Inactivated: 04/20/2021 Target Resolution Date: 04/23/2021 Goal Status: Met Ulcer/skin breakdown will have a volume reduction of 50% by week 8 Date Initiated: 04/20/2021 Target Resolution Date:  05/25/2021 Goal Status: Active Interventions: Assess patient/caregiver ability to obtain necessary supplies Assess patient/caregiver ability to perform ulcer/skin care regimen upon admission and as needed Assess ulceration(s) every visit Provide education on ulcer and skin care Treatment Activities: Skin care regimen initiated : 03/26/2021 Topical wound management initiated : 03/26/2021 Notes: Electronic Signature(s) Signed: 04/20/2021 9:05:35 AM By: Dellie Catholic RN Signed: 04/20/2021 5:18:59 PM By: Lorrin Jackson Entered By: Dellie Catholic on 04/20/2021 08:59:28 -------------------------------------------------------------------------------- Pain Assessment Details Patient Name: Date of Service: [address] [facility] C. 04/20/2021 8:15 A M Medical Record Number: [id number] Patient Account Number: [id number] Date of Birth/Sex: Treating RN: 05/22/1955 (66 y.o. Marcheta Grammes Primary Care Leshia Kope: Marton Redwood Other Clinician: Referring Salayah Meares: Treating Tanicka Bisaillon/Extender: Bethena Roys in Treatment: 3 Active Problems Location of Pain Severity and Description of Pain Patient Has Paino No Site Locations Pain Management and Medication Current Pain Management: Electronic Signature(s) Signed: 04/20/2021 5:18:59 PM By: Lorrin Jackson Entered By: Lorrin Jackson on 04/20/2021 08:11:34 -------------------------------------------------------------------------------- Patient/Caregiver Education Details Patient Name: Date of Service: Brooke Pace. 3/13/2023andnbsp8:15 A M Medical Record Number: [id number] Patient Account Number: [id number] Date of Birth/Gender: Treating RN: 04/09/1955 (66 y.o. Marcheta Grammes Primary Care Physician: Marton Redwood Other Clinician: Referring Physician: Treating Physician/Extender: Bethena Roys in Treatment: 3 Education  Assessment Education Provided To: Patient Education Topics Provided Elevated  Blood Sugar/ Impact on Healing: Methods: Explain/Verbal Responses: State content correctly Wound/Skin Impairment: Methods: Explain/Verbal, Printed Responses: State content correctly Electronic Signature(s) Signed: 04/20/2021 5:18:59 PM By: Lorrin Jackson Entered By: Lorrin Jackson on 04/20/2021 08:14:34 -------------------------------------------------------------------------------- Wound Assessment Details Patient Name: Date of Service: [address] [facility] C. 04/20/2021 8:15 A M Medical Record Number: [id number] Patient Account Number: [id number] Date of Birth/Sex: Treating RN: [birth year] (66 y.o. Marcheta Grammes Primary Care Corryn Madewell: Marton Redwood Other Clinician: Referring Brenner Visconti: Treating Rashmi Tallent/Extender: Bethena Roys in Treatment: 3 Wound Status Wound Number: 1 Primary Etiology: Diabetic Wound/Ulcer of the Lower Extremity Wound Location: Right, Plantar T Great oe Wound Status: Open Wounding Event: Gradually Appeared Comorbid History: Hypertension, Type II Diabetes, Gout, Neuropathy Date Acquired: 03/05/2021 Weeks Of Treatment: 3 Clustered Wound: No Photos Wound Measurements Length: (cm) 0.4 Width: (cm) 0.3 Depth: (cm) 0.3 Area: (cm) 0.094 Volume: (cm) 0.028 % Reduction in Area: 75.6% % Reduction in Volume: 26.3% Epithelialization: Medium (34-66%) Tunneling: No Undermining: No Wound Description Classification: Grade 1 Wound Margin: Distinct, outline attached Exudate Amount: Medium Exudate Type: Serosanguineous Exudate Color: red, brown Foul Odor After Cleansing: No Slough/Fibrino Yes Wound Bed Granulation Amount: Large (67-100%) Exposed Structure Granulation Quality: Red Fascia Exposed: No Necrotic Amount: Small (1-33%) Fat Layer (Subcutaneous Tissue) Exposed: Yes Necrotic Quality: Adherent Slough Tendon Exposed: No Muscle Exposed: No Joint Exposed: No Bone Exposed: No Treatment Notes Wound #1 (Toe Great) Wound Laterality:  Plantar, Right Cleanser Soap and Water Discharge Instruction: May shower and wash wound with dial antibacterial soap and water prior to dressing change. Wound Cleanser Discharge Instruction: Cleanse the wound with wound cleanser prior to applying a clean dressing using gauze sponges, not tissue or cotton balls. Peri-Wound Care Topical Primary Dressing Promogran Prisma Matrix, 4.34 (sq in) (silver collagen) Discharge Instruction: Moisten collagen with saline or hydrogel Secondary Dressing Woven Gauze Sponges 2x2 in Discharge Instruction: Apply over primary dressing as directed. Optifoam Non-Adhesive Dressing, 4x4 in Discharge Instruction: Apply over primary dressing as directed.Patient usually brings his own from home Secured With Barnard, Sterile 2x75 (in/in) Discharge Instruction: Secure with stretch gauze as directed. 4M Medipore Soft Cloth Surgical T 2x10 (in/yd) ape Discharge Instruction: Secure with tape as directed. Compression Wrap Compression Stockings Add-Ons Electronic Signature(s) Signed: 04/20/2021 8:20:16 AM By: Sandre Kitty Signed: 04/20/2021 5:18:59 PM By: Lorrin Jackson Entered By: Sandre Kitty on 04/20/2021 08:13:22 -------------------------------------------------------------------------------- Vitals Details Patient Name: Date of Service: [address] [facility] C. 04/20/2021 8:15 A M Medical Record Number: [id number] Patient Account Number: [id number] Date of Birth/Sex: Treating RN: 1955/08/26 (66 y.o. M) Primary Care Ravinder Hofland: Marton Redwood Other Clinician: Referring Holly Iannaccone: Treating Caitlen Worth/Extender: Bethena Roys in Treatment: 3 Vital Signs Time Taken: 08:08 Temperature (F): 98.4 Height (in): 73 Pulse (bpm): 94 Weight (lbs): 203 Respiratory Rate (breaths/min): 18 Body Mass Index (BMI): 26.8 Blood Pressure (mmHg): 173/94 Capillary Blood Glucose (mg/dl): 176 Reference Range: 80 - 120 mg /  dl Electronic Signature(s) Signed: 04/20/2021 8:20:16 AM By: Sandre Kitty Entered By: Sandre Kitty on 04/20/2021 08:10:55

## 2021-04-20 NOTE — Progress Notes (Signed)
[name], [name] Visit Report for 04/20/2021 Chief Complaint Document Details Patient Name: Date of Service: [name] [name] C. 04/20/2021 8:15 A M Medical Record Number: [id number] Patient Account Number: [id number] Date of Birth/Sex: Treating RN: 03-03-1955 (66 y.o. M) Primary Care Provider: [name] Other Clinician: Referring Provider: Treating Provider/Extender: [name] in Treatment: 3 Information Obtained from: Patient Chief Complaint 04/10/2021: patient is here for review of a wound on his plantar right first toe Electronic Signature(s) Signed: 04/20/2021 8:48:17 AM By: Duanne Guess MD FACS Entered By: Duanne Guess on 04/20/2021 08:48:16 -------------------------------------------------------------------------------- Debridement Details Patient Name: Date of Service: [name] 04/20/2021 8:15 A M Medical Record Number: [id number] Patient Account Number: [id number] Date of Birth/Sex: Treating RN: [birth year] (66 y.o. [name] Primary Care Provider: [name] Other Clinician: Referring Provider: Treating Provider/Extender: [name] in Treatment: 3 Debridement Performed for Assessment: Wound #1 Right,Plantar T Great oe Performed By: Physician Duanne Guess, MD Debridement Type: Debridement Severity of Tissue Pre Debridement: Fat layer exposed Level of Consciousness (Pre-procedure): Awake and Alert Pre-procedure Verification/Time Out Yes - 08:45 Taken: Start Time: 08:45 Pain Control: Other : Benzocaine T Area Debrided (L x W): otal 0.4 (cm) x 0.3 (cm) = 0.12 (cm) Tissue and other material debrided: Non-Viable, Callus, Slough, Subcutaneous, Slough Level: Skin/Subcutaneous Tissue Debridement Description: Excisional Instrument: Curette Bleeding: Minimum Hemostasis Achieved: Pressure End Time: 08:46 Procedural Pain: 0 Post Procedural Pain: 0 Response to Treatment:  Procedure was tolerated well Level of Consciousness (Post- Awake and Alert procedure): Post Debridement Measurements of Total Wound Length: (cm) 0.4 Width: (cm) 0.3 Depth: (cm) 0.3 Volume: (cm) 0.028 Character of Wound/Ulcer Post Debridement: Improved Severity of Tissue Post Debridement: Fat layer exposed Post Procedure Diagnosis Same as Pre-procedure Electronic Signature(s) Signed: 04/20/2021 9:05:35 AM By: Karie Schwalbe RN Signed: 04/20/2021 12:14:23 PM By: Duanne Guess MD FACS Entered By: Karie Schwalbe on 04/20/2021 08:47:37 -------------------------------------------------------------------------------- HPI Details Patient Name: Date of Service: [name] [place] [facility]. 04/20/2021 8:15 A M Medical Record Number: [id number] Patient Account Number: [id number] Date of Birth/Sex: Treating RN: 1955-11-17 (66 y.o. M) Primary Care Provider: [name] Other Clinician: Referring Provider: Treating Provider/Extender: [name] in Treatment: 3 History of Present Illness HPI Description: ADMISSION 03/26/2021 This is a [age]-year-old man with type 2 diabetes and peripheral polyneuropathy. He noticed a wound on his distal phalanx of his right great toe several months ago. This did not make any progress after several visits to see Dr. 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 This is healed nicely however he developed a subsequent blister on the proximal phalanx remanent and has developed an open wound here and he is here for that reason. He has been using Santyl. Our intake nurse noted slight undermining. Dr. Victorino Dike gave him a Darco forefoot offloading shoe although he could not wear this. He is now back into usual foot wear at work and he was wearing crocs although he figured this did not do the wound any good. Past medical history includes type 2 diabetes with peripheral neuropathy, Barrett's esophagus, stage  III chronic renal failure, gout he takes Lasix as needed ABI in our clinic clinic was noncompressible 04/02/2021: The patient has been compliant with offloading. He feels like the wound is improving, although he admits that it is difficult for him to adequately visualize it on the bottom of his toe. He  has been wearing his surgical shoe and is currently in silver collagen with an OPTi foam donut surrounding the site. There is some callus buildup around the edges of the wound, but overall the longitudinal measurement and undermining have decreased. 04/10/2021: He continues to be compliant with his offloading. The wound is smaller again today. He continues to have callus buildup around the edges of the wound, but has been wearing his surgical shoe with silver collagen and an OPTi foam doughnut. 04/20/2021: He went to [place] with his family and was on his feet for several days. Despite wearing the surgical shoe, he did have a little bit of a setback with minimal increase in wound size with callus buildup. He is still in silver collagen with a foam doughnut. Electronic Signature(s) Signed: 04/20/2021 8:49:10 AM By: Duanne Guess MD FACS Entered By: Duanne Guess on 04/20/2021 08:49:10 -------------------------------------------------------------------------------- Physical Exam Details Patient Name: Date of Service: [name] 04/20/2021 8:15 A M Medical Record Number: [id number] Patient Account Number: [id number] Date of Birth/Sex: Treating RN: Aug 21, 1955 (66 y.o. M) Primary Care Provider: [name] Other Clinician: Referring Provider: Treating Provider/Extender: [name] in Treatment: 3 Constitutional He is hypertensive, but asymptomatic.. . . . No acute distress. Respiratory . Notes 04/20/2021: Wound examon the plantar surface of the right great toe, the small circular wound is still present. It is minimally increased in size, with a little bit more  peripheral callus. Good granulation tissue on the wound surface. Electronic Signature(s) Signed: 04/20/2021 8:50:43 AM By: Duanne Guess MD FACS Entered By: Duanne Guess on 04/20/2021 08:50:43 -------------------------------------------------------------------------------- Physician Orders Details Patient Name: Date of Service: [address] [place] C. 04/20/2021 8:15 A M Medical Record Number: [id number] Patient Account Number: [id number] Date of Birth/Sex: Treating RN: January 11, 1956 (66 y.o. [name] Primary Care Provider: [name] Other Clinician: Referring Provider: Treating Provider/Extender: [name] in Treatment: 3 Verbal / Phone Orders: No Diagnosis Coding ICD-10 Coding Code Description E11.621 Type 2 diabetes mellitus with foot ulcer L97.518 Non-pressure chronic ulcer of other part of right foot with other specified severity E11.42 Type 2 diabetes mellitus with diabetic polyneuropathy Follow-up Appointments ppointment in 1 week. - Next week Thursday or Friday with Dr. Lady Gary Return A Bathing/ Shower/ Hygiene May shower with protection but do not get wound dressing(s) wet. Off-Loading Wound #1 Right,Plantar T Great oe Open toe surgical shoe to: - right foot Wound Treatment Wound #1 - T Great oe Wound Laterality: Plantar, Right Cleanser: Soap and Water Every Other Day/30 Days Discharge Instructions: May shower and wash wound with dial antibacterial soap and water prior to dressing change. Cleanser: Wound Cleanser Every Other Day/30 Days Discharge Instructions: Cleanse the wound with wound cleanser prior to applying a clean dressing using gauze sponges, not tissue or cotton balls. Prim Dressing: Promogran Prisma Matrix, 4.34 (sq in) (silver collagen) (Dispense As Written) Every Other Day/30 Days ary Discharge Instructions: Moisten collagen with saline or hydrogel Secondary Dressing: Woven Gauze Sponges 2x2 in Every Other Day/30  Days Discharge Instructions: Apply over primary dressing as directed. Secondary Dressing: Optifoam Non-Adhesive Dressing, 4x4 in (Generic) Every Other Day/30 Days Discharge Instructions: Apply over primary dressing as directed.Patient usually brings his own from home Secured With: Conforming Stretch Gauze Bandage, Sterile 2x75 (in/in) (Generic) Every Other Day/30 Days Discharge Instructions: Secure with stretch gauze as directed. Secured With: 29M Medipore Scientist, research (life sciences) Surgical T 2x10 (in/yd) Every Other Day/30 Days ape Discharge Instructions: Secure with tape  as directed. Electronic Signature(s) Signed: 04/20/2021 9:05:35 AM By: Karie Schwalbe RN Signed: 04/20/2021 12:14:23 PM By: Duanne Guess MD FACS Entered By: Karie Schwalbe on 04/20/2021 09:00:23 -------------------------------------------------------------------------------- Problem List Details Patient Name: Date of Service: [address] [place] C. 04/20/2021 8:15 A M Medical Record Number: [id number] Patient Account Number: [id number] Date of Birth/Sex: Treating RN: [birth year] (66 y.o. Lytle Michaels Primary Care Provider: [name] Other Clinician: Referring Provider: Treating Provider/Extender: [name] in Treatment: 3 Active Problems ICD-10 Encounter Code Description Active Date MDM Diagnosis E11.621 Type 2 diabetes mellitus with foot ulcer 03/26/2021 No Yes L97.518 Non-pressure chronic ulcer of other part of right foot with other specified 03/26/2021 No Yes severity E11.42 Type 2 diabetes mellitus with diabetic polyneuropathy 03/26/2021 No Yes Inactive Problems Resolved Problems Electronic Signature(s) Signed: 04/20/2021 8:47:48 AM By: Duanne Guess MD FACS Entered By: Duanne Guess on 04/20/2021 08:47:48 -------------------------------------------------------------------------------- Progress Note Details Patient Name: Date of Service: [address] [facility] C. 04/20/2021 8:15 A M Medical  Record Number: [id number] Patient Account Number: [id number] Date of Birth/Sex: Treating RN: September 09, 1955 (66 y.o. M) Primary Care Provider: [name] Other Clinician: Referring Provider: Treating Provider/Extender: [name] in Treatment: 3 Subjective Chief Complaint Information obtained from Patient 04/10/2021: patient is here for review of a wound on his plantar right first toe History of Present Illness (HPI) ADMISSION 03/26/2021 This is a [age]-year-old man with type 2 diabetes and peripheral polyneuropathy. He noticed a wound on his distal phalanx of his right great toe several months ago. This did not make any progress after several visits to see Dr. 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 This is healed nicely however he developed a subsequent blister on the proximal phalanx remanent and has developed an open wound here and he is here for that reason. He has been using Santyl. Our intake nurse noted slight undermining. Dr. Victorino Dike gave him a Darco forefoot offloading shoe although he could not wear this. He is now back into usual foot wear at work and he was wearing crocs although he figured this did not do the wound any good. Past medical history includes type 2 diabetes with peripheral neuropathy, Barrett's esophagus, stage III chronic renal failure, gout he takes Lasix as needed ABI in our clinic clinic was noncompressible 04/02/2021: The patient has been compliant with offloading. He feels like the wound is improving, although he admits that it is difficult for him to adequately visualize it on the bottom of his toe. He has been wearing his surgical shoe and is currently in silver collagen with an OPTi foam donut surrounding the site. There is some callus buildup around the edges of the wound, but overall the longitudinal measurement and undermining have decreased. 04/10/2021: He continues to be compliant  with his offloading. The wound is smaller again today. He continues to have callus buildup around the edges of the wound, but has been wearing his surgical shoe with silver collagen and an OPTi foam doughnut. 04/20/2021: He went to [place] with his family and was on his feet for several days. Despite wearing the surgical shoe, he did have a little bit of a setback with minimal increase in wound size with callus buildup. He is still in silver collagen with a foam doughnut. Patient History Information obtained from Patient, Chart. Family History Cancer - Father, Diabetes - Siblings,Father,Child, Heart Disease - Mother, No family history of Hereditary Spherocytosis,  Hypertension, Kidney Disease, Lung Disease, Seizures, Stroke, Thyroid Problems, Tuberculosis. Social History Never smoker, Marital Status - Married, Alcohol Use - Never, Drug Use - No History, Caffeine Use - Daily. Medical History Eyes Denies history of Cataracts, Glaucoma, Optic Neuritis Ear/Nose/Mouth/Throat Denies history of Chronic sinus problems/congestion, Middle ear problems Cardiovascular Patient has history of Hypertension Endocrine Patient has history of Type II Diabetes Genitourinary Denies history of End Stage Renal Disease Integumentary (Skin) Denies history of History of Burn Musculoskeletal Patient has history of Gout Denies history of Rheumatoid Arthritis, Osteoarthritis, Osteomyelitis Neurologic Patient has history of Neuropathy Denies history of Dementia, Quadriplegia, Paraplegia, Seizure Disorder Oncologic Denies history of Received Chemotherapy, Received Radiation Psychiatric Denies history of Anorexia/bulimia, Confinement Anxiety Hospitalization/Surgery History - rigth hallux amputation. - meniscus tears repairs. Medical A Surgical History Notes nd Cardiovascular Mitral valve prolapse Genitourinary CKD stage 3, barrett's esophagus Objective Constitutional He is hypertensive, but asymptomatic.Marland Kitchen No  acute distress. Vitals Time Taken: 8:08 AM, Height: 73 in, Weight: 203 lbs, BMI: 26.8, Temperature: 98.4 F, Pulse: 94 bpm, Respiratory Rate: 18 breaths/min, Blood Pressure: 173/94 mmHg, Capillary Blood Glucose: 176 mg/dl. General Notes: 04/20/2021: Wound examooon the plantar surface of the right great toe, the small circular wound is still present. It is minimally increased in size, with a little bit more peripheral callus. Good granulation tissue on the wound surface. Integumentary (Hair, Skin) Wound #1 status is Open. Original cause of wound was Gradually Appeared. The date acquired was: 03/05/2021. The wound has been in treatment 3 weeks. The wound is located on the [employer]. The wound measures 0.4cm length x 0.3cm width x 0.3cm depth; 0.094cm^2 area and 0.028cm^3 volume. oe There is Fat Layer (Subcutaneous Tissue) exposed. There is no tunneling or undermining noted. There is a medium amount of serosanguineous drainage noted. The wound margin is distinct with the outline attached to the wound base. There is large (67-100%) red granulation within the wound bed. There is a small (1-33%) amount of necrotic tissue within the wound bed including Adherent Slough. Assessment Active Problems ICD-10 Type 2 diabetes mellitus with foot ulcer Non-pressure chronic ulcer of other part of right foot with other specified severity Type 2 diabetes mellitus with diabetic polyneuropathy Procedures Wound #1 Pre-procedure diagnosis of Wound #1 is a Diabetic Wound/Ulcer of the Lower Extremity located on the Right,Plantar T Great .Severity of Tissue Pre oe Debridement is: Fat layer exposed. There was a Excisional Skin/Subcutaneous Tissue Debridement with a total area of 0.12 sq cm performed by Duanne Guess, MD. With the following instrument(s): Curette to remove Non-Viable tissue/material. Material removed includes Callus, Subcutaneous Tissue, and Slough after achieving pain control using Other  (Benzocaine). No specimens were taken. A time out was conducted at 08:45, prior to the start of the procedure. A Minimum amount of bleeding was controlled with Pressure. The procedure was tolerated well with a pain level of 0 throughout and a pain level of 0 following the procedure. Post Debridement Measurements: 0.4cm length x 0.3cm width x 0.3cm depth; 0.028cm^3 volume. Character of Wound/Ulcer Post Debridement is improved. Severity of Tissue Post Debridement is: Fat layer exposed. Post procedure Diagnosis Wound #1: Same as Pre-Procedure Plan Follow-up Appointments: Return Appointment in 1 week. - Next week Thursday or Friday with Dr. Shelly Flatten Shower/ Hygiene: May shower with protection but do not get wound dressing(s) wet. Off-Loading: Wound #1 Right,Plantar T Great: oe Open toe surgical shoe to: - right foot WOUND #1: - T Great Wound Laterality: Plantar, Right oe Cleanser: Soap and  Water Every Other Day/30 Days Discharge Instructions: May shower and wash wound with dial antibacterial soap and water prior to dressing change. Cleanser: Wound Cleanser Every Other Day/30 Days Discharge Instructions: Cleanse the wound with wound cleanser prior to applying a clean dressing using gauze sponges, not tissue or cotton balls. Prim Dressing: Promogran Prisma Matrix, 4.34 (sq in) (silver collagen) (Dispense As Written) Every Other Day/30 Days ary Discharge Instructions: Moisten collagen with saline or hydrogel Secondary Dressing: Woven Gauze Sponges 2x2 in Every Other Day/30 Days Discharge Instructions: Apply over primary dressing as directed. Secondary Dressing: Optifoam Non-Adhesive Dressing, 4x4 in (Generic) Every Other Day/30 Days Discharge Instructions: Apply over primary dressing as directed.Patient usually brings his own from home Secured With: Conforming Stretch Gauze Bandage, Sterile 2x75 (in/in) (Generic) Every Other Day/30 Days Discharge Instructions: Secure with stretch gauze as  directed. Secured With: 14M Medipore Scientist, research (life sciences) Surgical T 2x10 (in/yd) Every Other Day/30 Days ape Discharge Instructions: Secure with tape as directed. 04/20/2021: on the plantar surface of the right great toe, the small circular wound is still present. It is minimally increased in size, with a little bit more peripheral callus. Good granulation tissue on the wound surface. I think the increase in size is likely secondary to the amount of time he was spending on his feet while at [facility] The additional callus was debrided today. Continue silver collagen with foam doughnut and forefoot offloading shoe. Follow-up in 1 week. Electronic Signature(s) Signed: 04/20/2021 9:11:48 AM By: Duanne Guess MD FACS Previous Signature: 04/20/2021 8:52:19 AM Version By: Duanne Guess MD FACS Entered By: Duanne Guess on 04/20/2021 09:11:47 -------------------------------------------------------------------------------- HxROS Details Patient Name: Date of Service: [address] [place] C. 04/20/2021 8:15 A M Medical Record Number: [id number] Patient Account Number: [id number] Date of Birth/Sex: Treating RN: [birth year] (66 y.o. M) Primary Care Provider: [name] Other Clinician: Referring Provider: Treating Provider/Extender: [name] in Treatment: 3 Information Obtained From Patient Chart Eyes Medical History: Negative for: Cataracts; Glaucoma; Optic Neuritis Ear/Nose/Mouth/Throat Medical History: Negative for: Chronic sinus problems/congestion; Middle ear problems Cardiovascular Medical History: Positive for: Hypertension Past Medical History Notes: Mitral valve prolapse Endocrine Medical History: Positive for: Type II Diabetes Time with diabetes: since 2010 Treated with: Insulin Blood sugar tested every day: No Genitourinary Medical History: Negative for: End Stage Renal Disease Past Medical History Notes: CKD stage 3, barrett's esophagus Integumentary  (Skin) Medical History: Negative for: History of Burn Musculoskeletal Medical History: Positive for: Gout Negative for: Rheumatoid Arthritis; Osteoarthritis; Osteomyelitis Neurologic Medical History: Positive for: Neuropathy Negative for: Dementia; Quadriplegia; Paraplegia; Seizure Disorder Oncologic Medical History: Negative for: Received Chemotherapy; Received Radiation Psychiatric Medical History: Negative for: Anorexia/bulimia; Confinement Anxiety Immunizations Pneumococcal Vaccine: Received Pneumococcal Vaccination: No Implantable Devices None Hospitalization / Surgery History Type of Hospitalization/Surgery rigth hallux amputation meniscus tears repairs Family and Social History Cancer: Yes - Father; Diabetes: Yes - Siblings,Father,Child; Heart Disease: Yes - Mother; Hereditary Spherocytosis: No; Hypertension: No; Kidney Disease: No; Lung Disease: No; Seizures: No; Stroke: No; Thyroid Problems: No; Tuberculosis: No; Never smoker; Marital Status - Married; Alcohol Use: Never; Drug Use: No History; Caffeine Use: Daily; Financial Concerns: No; Food, Clothing or Shelter Needs: No; Support System Lacking: No; Transportation Concerns: No Physician Affirmation I have reviewed and agree with the above information. Electronic Signature(s) Signed: 04/20/2021 12:14:23 PM By: Duanne Guess MD FACS Entered By: Duanne Guess on 04/20/2021 08:49:50 -------------------------------------------------------------------------------- SuperBill Details Patient Name: Date of Service: [place] [place] C. 04/20/2021 Medical Record Number: [id number]  Patient Account Number: [id number] Date of Birth/Sex: Treating RN: 12/01/1955 (66 y.o. M) Primary Care Provider: Martha ClanShaw, William Other Clinician: Referring Provider: Treating Provider/Extender: Lacretia Leighannon, Tram Wrenn Shaw, [name] in Treatment: 3 Diagnosis Coding ICD-10 Codes Code Description [phone] Type 2 diabetes mellitus with foot  ulcer L97.518 Non-pressure chronic ulcer of other part of right foot with other specified severity E11.42 Type 2 diabetes mellitus with diabetic polyneuropathy Facility Procedures CPT4 Code: [id number] Description: 11042 - DEB SUBQ TISSUE 20 SQ CM/< ICD-10 Diagnosis Description L97.518 Non-pressure chronic ulcer of other part of right foot with other specified sev E11.621 Type 2 diabetes mellitus with foot ulcer Modifier: erity Quantity: 1 Physician Procedures : CPT4 Code Description Modifier [id number] 11042 - WC PHYS SUBQ TISS 20 SQ CM ICD-10 Diagnosis Description L97.518 Non-pressure chronic ulcer of other part of right foot with other specified severity E11.621 Type 2 diabetes mellitus with foot ulcer Quantity: 1 Electronic Signature(s) Signed: 04/20/2021 9:12:00 AM By: Duanne Guessannon, Angelos Wasco MD FACS Entered By: Duanne Guessannon, Joyice Magda on 04/20/2021 09:12:00

## 2021-04-23 ENCOUNTER — Other Ambulatory Visit: Payer: Self-pay

## 2021-04-23 ENCOUNTER — Inpatient Hospital Stay (HOSPITAL_COMMUNITY): Payer: Managed Care, Other (non HMO)

## 2021-04-23 ENCOUNTER — Encounter (HOSPITAL_COMMUNITY): Payer: Self-pay

## 2021-04-23 ENCOUNTER — Inpatient Hospital Stay (HOSPITAL_COMMUNITY): Payer: Managed Care, Other (non HMO) | Admitting: Anesthesiology

## 2021-04-23 ENCOUNTER — Encounter (HOSPITAL_BASED_OUTPATIENT_CLINIC_OR_DEPARTMENT_OTHER): Payer: Managed Care, Other (non HMO) | Admitting: General Surgery

## 2021-04-23 ENCOUNTER — Emergency Department (HOSPITAL_COMMUNITY): Payer: Managed Care, Other (non HMO)

## 2021-04-23 ENCOUNTER — Inpatient Hospital Stay (HOSPITAL_COMMUNITY)
Admission: EM | Admit: 2021-04-23 | Discharge: 2021-04-24 | DRG: 617 | Disposition: A | Discharge status: Home / Self Care | Payer: Managed Care, Other (non HMO) | Attending: Internal Medicine | Admitting: Internal Medicine

## 2021-04-23 ENCOUNTER — Encounter (HOSPITAL_COMMUNITY)
Admission: EM | Disposition: A | Discharge status: Home / Self Care | Payer: Self-pay | Source: Home / Self Care | Attending: Internal Medicine

## 2021-04-23 DIAGNOSIS — E1142 Type 2 diabetes mellitus with diabetic polyneuropathy: Secondary | ICD-10-CM | POA: Diagnosis present

## 2021-04-23 DIAGNOSIS — E11621 Type 2 diabetes mellitus with foot ulcer: Secondary | ICD-10-CM | POA: Diagnosis present

## 2021-04-23 DIAGNOSIS — B951 Streptococcus, group B, as the cause of diseases classified elsewhere: Secondary | ICD-10-CM | POA: Diagnosis present

## 2021-04-23 DIAGNOSIS — I129 Hypertensive chronic kidney disease with stage 1 through stage 4 chronic kidney disease, or unspecified chronic kidney disease: Secondary | ICD-10-CM | POA: Diagnosis present

## 2021-04-23 DIAGNOSIS — E1169 Type 2 diabetes mellitus with other specified complication: Principal | ICD-10-CM | POA: Diagnosis present

## 2021-04-23 DIAGNOSIS — Z79899 Other long term (current) drug therapy: Secondary | ICD-10-CM

## 2021-04-23 DIAGNOSIS — L97519 Non-pressure chronic ulcer of other part of right foot with unspecified severity: Secondary | ICD-10-CM | POA: Diagnosis present

## 2021-04-23 DIAGNOSIS — Z66 Do not resuscitate: Secondary | ICD-10-CM | POA: Diagnosis present

## 2021-04-23 DIAGNOSIS — Z7984 Long term (current) use of oral hypoglycemic drugs: Secondary | ICD-10-CM | POA: Diagnosis not present

## 2021-04-23 DIAGNOSIS — M869 Osteomyelitis, unspecified: Secondary | ICD-10-CM | POA: Diagnosis present

## 2021-04-23 DIAGNOSIS — L089 Local infection of the skin and subcutaneous tissue, unspecified: Secondary | ICD-10-CM | POA: Diagnosis not present

## 2021-04-23 DIAGNOSIS — N1832 Chronic kidney disease, stage 3b: Secondary | ICD-10-CM | POA: Diagnosis present

## 2021-04-23 DIAGNOSIS — Z794 Long term (current) use of insulin: Secondary | ICD-10-CM

## 2021-04-23 DIAGNOSIS — K2270 Barrett's esophagus without dysplasia: Secondary | ICD-10-CM | POA: Diagnosis present

## 2021-04-23 DIAGNOSIS — I341 Nonrheumatic mitral (valve) prolapse: Secondary | ICD-10-CM | POA: Diagnosis present

## 2021-04-23 DIAGNOSIS — E11628 Type 2 diabetes mellitus with other skin complications: Secondary | ICD-10-CM | POA: Diagnosis present

## 2021-04-23 DIAGNOSIS — I10 Essential (primary) hypertension: Secondary | ICD-10-CM | POA: Diagnosis present

## 2021-04-23 DIAGNOSIS — E1165 Type 2 diabetes mellitus with hyperglycemia: Secondary | ICD-10-CM | POA: Diagnosis present

## 2021-04-23 DIAGNOSIS — Z881 Allergy status to other antibiotic agents status: Secondary | ICD-10-CM

## 2021-04-23 DIAGNOSIS — L03115 Cellulitis of right lower limb: Secondary | ICD-10-CM | POA: Diagnosis present

## 2021-04-23 DIAGNOSIS — M109 Gout, unspecified: Secondary | ICD-10-CM | POA: Diagnosis present

## 2021-04-23 DIAGNOSIS — Z20822 Contact with and (suspected) exposure to covid-19: Secondary | ICD-10-CM | POA: Diagnosis present

## 2021-04-23 DIAGNOSIS — E1122 Type 2 diabetes mellitus with diabetic chronic kidney disease: Secondary | ICD-10-CM | POA: Diagnosis present

## 2021-04-23 HISTORY — DX: Raynaud's syndrome without gangrene: I73.00

## 2021-04-23 HISTORY — DX: Chronic kidney disease, stage 3b: N18.32

## 2021-04-23 HISTORY — DX: Type 2 diabetes mellitus with hyperglycemia: Z79.4

## 2021-04-23 HISTORY — DX: Essential (primary) hypertension: I10

## 2021-04-23 HISTORY — PX: I & D EXTREMITY: SHX5045

## 2021-04-23 HISTORY — DX: Barrett's esophagus without dysplasia: K22.70

## 2021-04-23 HISTORY — DX: Type 2 diabetes mellitus with hyperglycemia: E11.65

## 2021-04-23 LAB — CBC WITH DIFFERENTIAL/PLATELET
Abs Immature Granulocytes: 0.02 10*3/uL (ref 0.00–0.07)
Basophils Absolute: 0 10*3/uL (ref 0.0–0.1)
Basophils Relative: 0 %
Eosinophils Absolute: 0.1 10*3/uL (ref 0.0–0.5)
Eosinophils Relative: 1 %
HCT: 38.2 % — ABNORMAL LOW (ref 39.0–52.0)
Hemoglobin: 13.1 g/dL (ref 13.0–17.0)
Immature Granulocytes: 0 %
Lymphocytes Relative: 20 %
Lymphs Abs: 1.9 10*3/uL (ref 0.7–4.0)
MCH: 29.6 pg (ref 26.0–34.0)
MCHC: 34.3 g/dL (ref 30.0–36.0)
MCV: 86.2 fL (ref 80.0–100.0)
Monocytes Absolute: 0.8 10*3/uL (ref 0.1–1.0)
Monocytes Relative: 8 %
Neutro Abs: 6.4 10*3/uL (ref 1.7–7.7)
Neutrophils Relative %: 71 %
Platelets: 195 10*3/uL (ref 150–400)
RBC: 4.43 MIL/uL (ref 4.22–5.81)
RDW: 12.3 % (ref 11.5–15.5)
WBC: 9.2 10*3/uL (ref 4.0–10.5)
nRBC: 0 % (ref 0.0–0.2)

## 2021-04-23 LAB — PREALBUMIN: Prealbumin: 17.7 mg/dL — ABNORMAL LOW (ref 18–38)

## 2021-04-23 LAB — BASIC METABOLIC PANEL
Anion gap: 9 (ref 5–15)
BUN: 46 mg/dL — ABNORMAL HIGH (ref 8–23)
CO2: 22 mmol/L (ref 22–32)
Calcium: 9 mg/dL (ref 8.9–10.3)
Chloride: 105 mmol/L (ref 98–111)
Creatinine, Ser: 2.32 mg/dL — ABNORMAL HIGH (ref 0.61–1.24)
GFR, Estimated: 30 mL/min — ABNORMAL LOW (ref >60)
Glucose, Bld: 157 mg/dL — ABNORMAL HIGH (ref 70–99)
Potassium: 4.2 mmol/L (ref 3.5–5.1)
Sodium: 136 mmol/L (ref 135–145)

## 2021-04-23 LAB — CREATININE, SERUM
Creatinine, Ser: 2.05 mg/dL — ABNORMAL HIGH (ref 0.61–1.24)
GFR, Estimated: 35 mL/min — ABNORMAL LOW (ref >60)

## 2021-04-23 LAB — GLUCOSE, CAPILLARY
Glucose-Capillary: 141 mg/dL — ABNORMAL HIGH (ref 70–99)
Glucose-Capillary: 120 mg/dL — ABNORMAL HIGH (ref 70–99)
Glucose-Capillary: 119 mg/dL — ABNORMAL HIGH (ref 70–99)
Glucose-Capillary: 109 mg/dL — ABNORMAL HIGH (ref 70–99)

## 2021-04-23 LAB — RESP PANEL BY RT-PCR (FLU A&B, COVID) ARPGX2
Influenza A by PCR: NEGATIVE
Influenza B by PCR: NEGATIVE
SARS Coronavirus 2 by RT PCR: NEGATIVE

## 2021-04-23 LAB — LACTIC ACID, PLASMA: Lactic Acid, Venous: 0.7 mmol/L (ref 0.5–1.9)

## 2021-04-23 LAB — C-REACTIVE PROTEIN: CRP: 7.9 mg/dL — ABNORMAL HIGH (ref <1.0)

## 2021-04-23 LAB — HIV ANTIBODY (ROUTINE TESTING W REFLEX): HIV Screen 4th Generation wRfx: NONREACTIVE

## 2021-04-23 LAB — SEDIMENTATION RATE: Sed Rate: 79 mm/hr — ABNORMAL HIGH (ref 0–16)

## 2021-04-23 IMAGING — MR MR FOOT*R* W/O CM
4 of 5 series · 19 of 40 positions shown · non-contrast
Comparison: Radiographs, same date.

CLINICAL DATA: I have left of there is identified. ZEINAB comment
can you take out a few moisture deformity still started ZEINAB.
At foot pain and swelling. Diabetic.

EXAM:
MRI OF THE RIGHT FOREFOOT WITHOUT CONTRAST
TECHNIQUE: Multiplanar, multisequence MR imaging of the right foot was
performed. No intravenous contrast was administered.

[Series 2: T1 · coronal · 3.0mm · 0.27mm/px · 4 of 79 slices shown (1 of 2)]
[im 1/79]
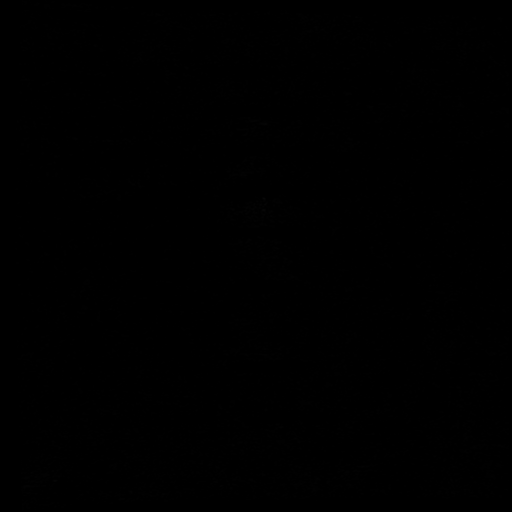
[im 14/79]
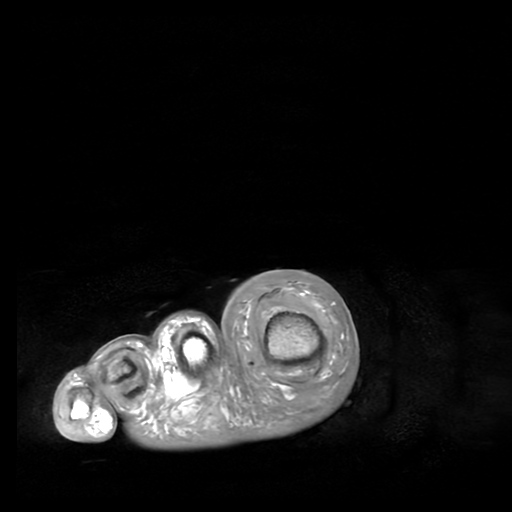
[im 40/79]
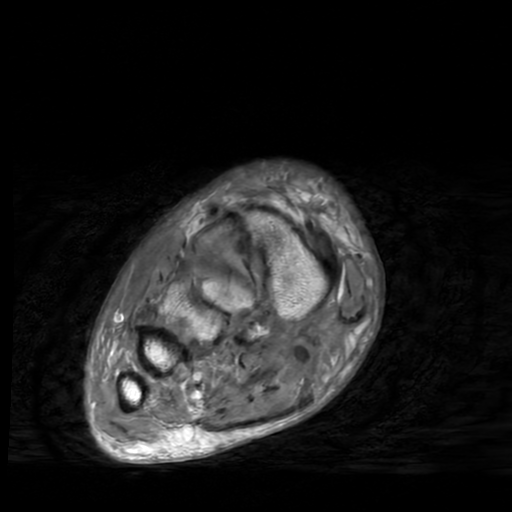
[im 66/79]
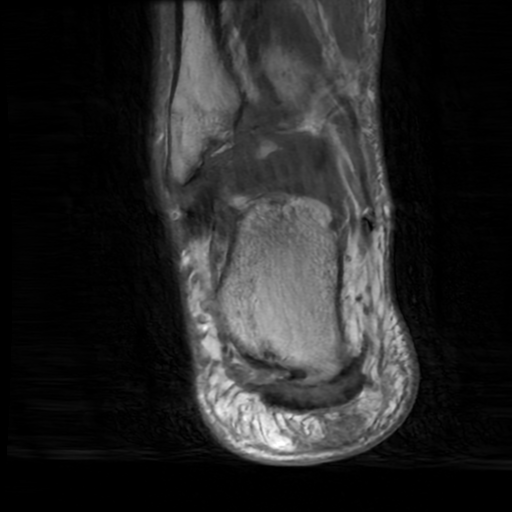

[Series 3: T2 fat-sat · coronal · 3.0mm · 0.27mm/px · 9 of 79 slices shown]
[im 1/79]
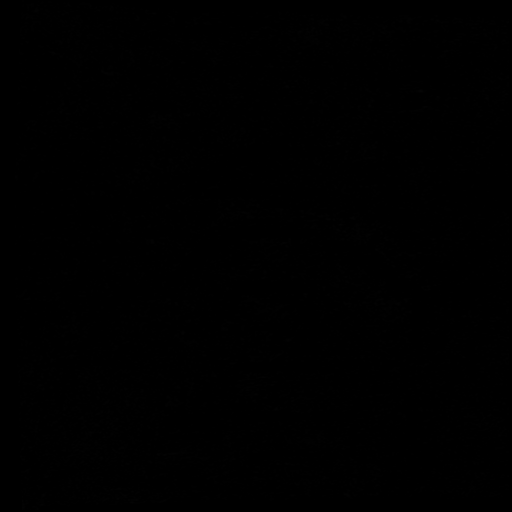
[im 15/79]
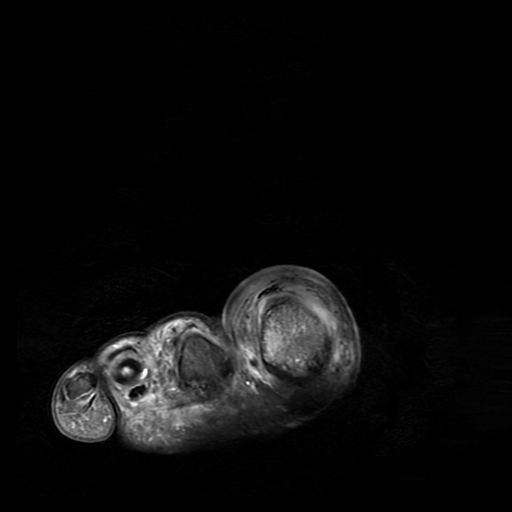
[im 22/79]
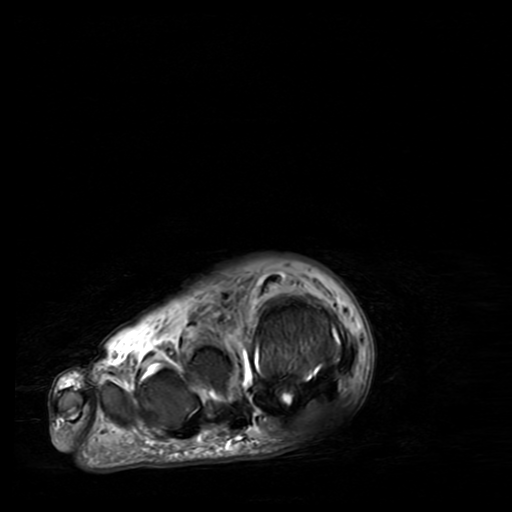
[im 36/79]
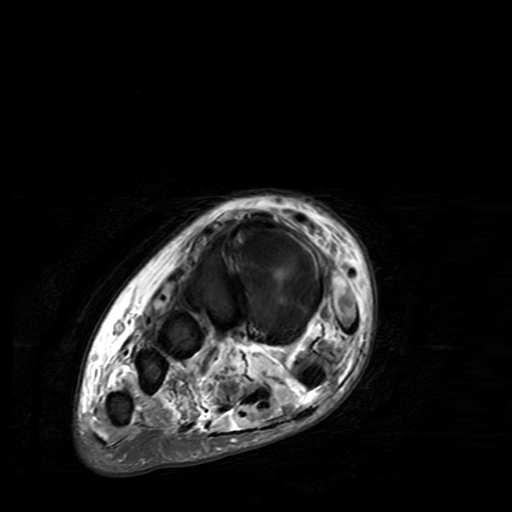
[im 43/79]
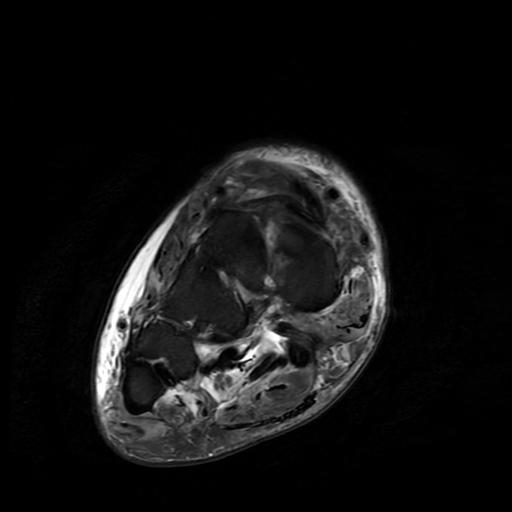
[im 57/79]
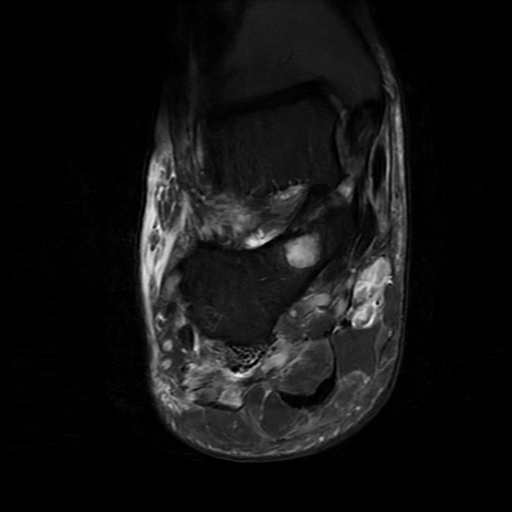
[im 64/79]
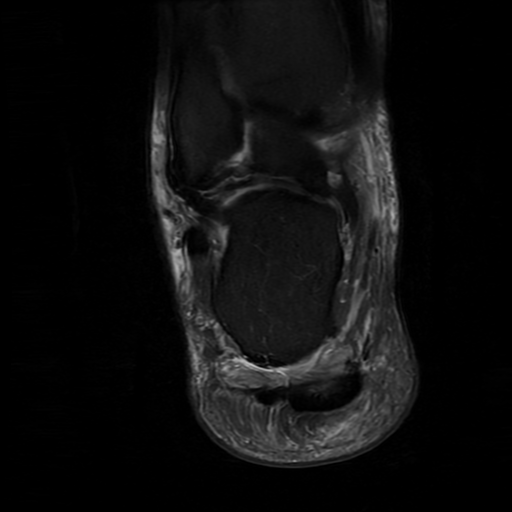
[im 71/79]
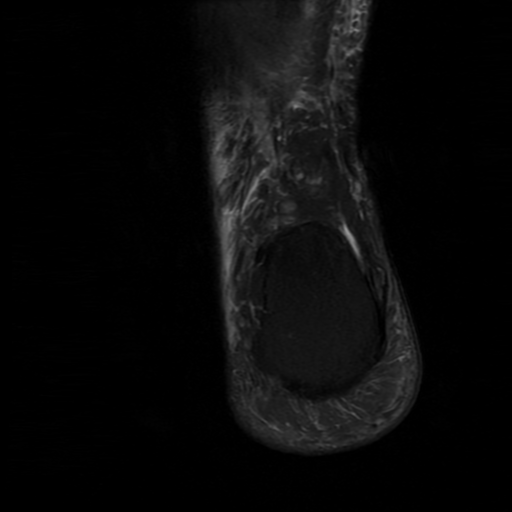
[im 79/79]
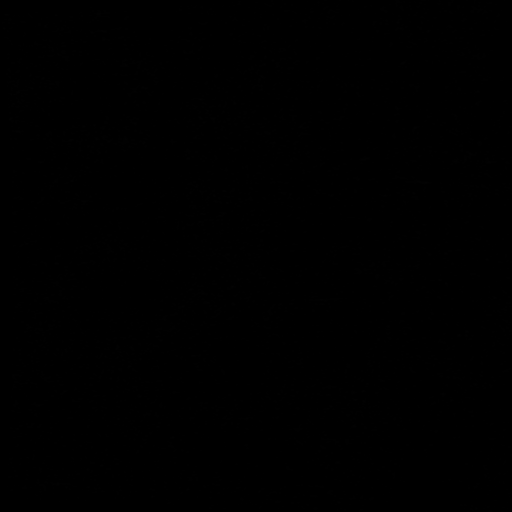

[Series 5: T1 · axial · 3.0mm · 0.59mm/px · z∈[-66,+46]mm · 3 of 33 slices shown (2 of 2)]
[im 1/33]
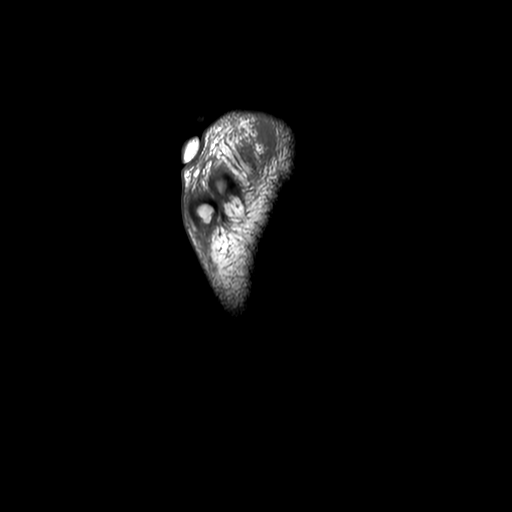
[im 17/33]
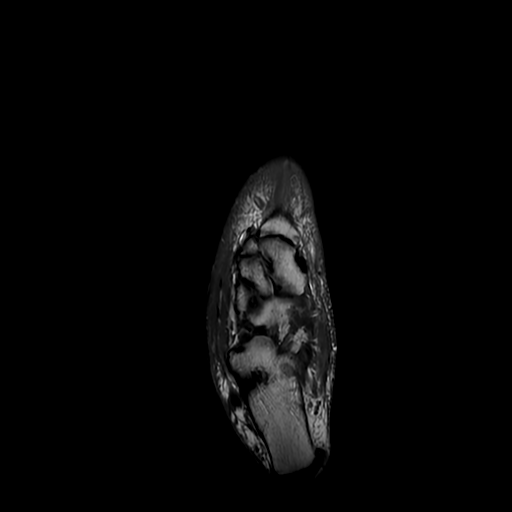
[im 33/33]
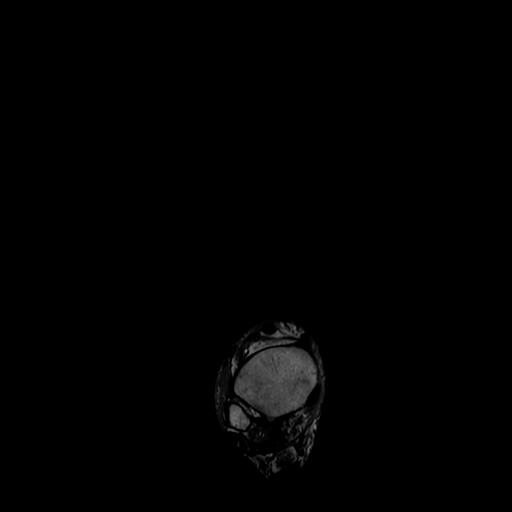

[Series 6: STIR · axial · 3.0mm · 0.59mm/px · z∈[-66,+46]mm · 3 of 33 slices shown]
[im 1/33]
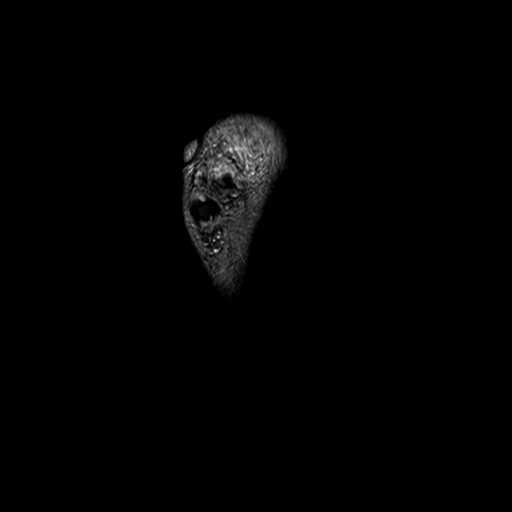
[im 17/33]
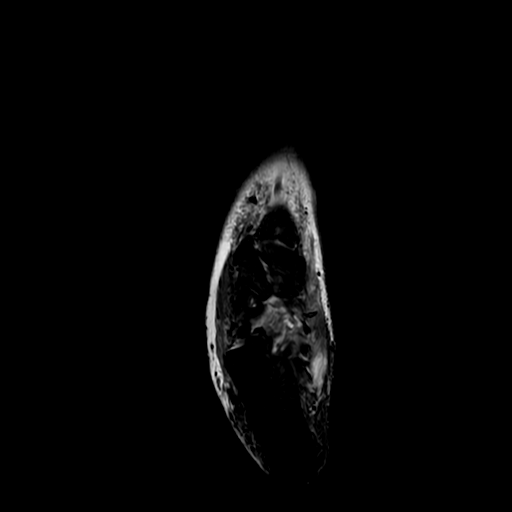
[im 33/33]
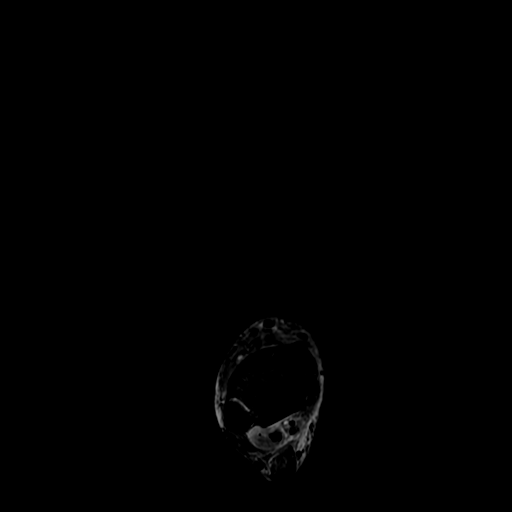

[19 of 40 positions shown; findings below may reference images not displayed]

FINDINGS: Surgical changes from prior distal phalanx resection of the great
toe. There is a 2.8 cm fluid collection along the distal aspect of
the proximal phalanx of the great toe consistent with an abscess.
This appears to communicate with a small open wound.

Abnormal T1 and T2 signal intensity in the proximal phalanx of the
great toe consistent with osteomyelitis. I do not see any definite
findings for septic arthritis at the first MTP joint. No other sites
of osteomyelitis are identified.

There is diffuse cellulitis and myofasciitis without definite MR
findings for pyomyositis.
IMPRESSION: 1. Surgical changes from prior distal phalanx resection of the great
toe.
2. 2.8 cm fluid collection along the distal aspect of the proximal
phalanx of the great toe consistent with an abscess.
3. Abnormal T1 and T2 signal intensity in the proximal phalanx of
the great toe consistent with osteomyelitis.
4. Diffuse cellulitis and myofasciitis without definite MR findings
for pyomyositis.

## 2021-04-23 IMAGING — CR DG FOOT COMPLETE 3+V*R*
3 series · 3 of 3 positions shown · non-contrast
Comparison: None.

CLINICAL DATA: Partial amputation patent tail with nonhealing sore

EXAM:
RIGHT FOOT COMPLETE - 3+ VIEW

[foot ap]
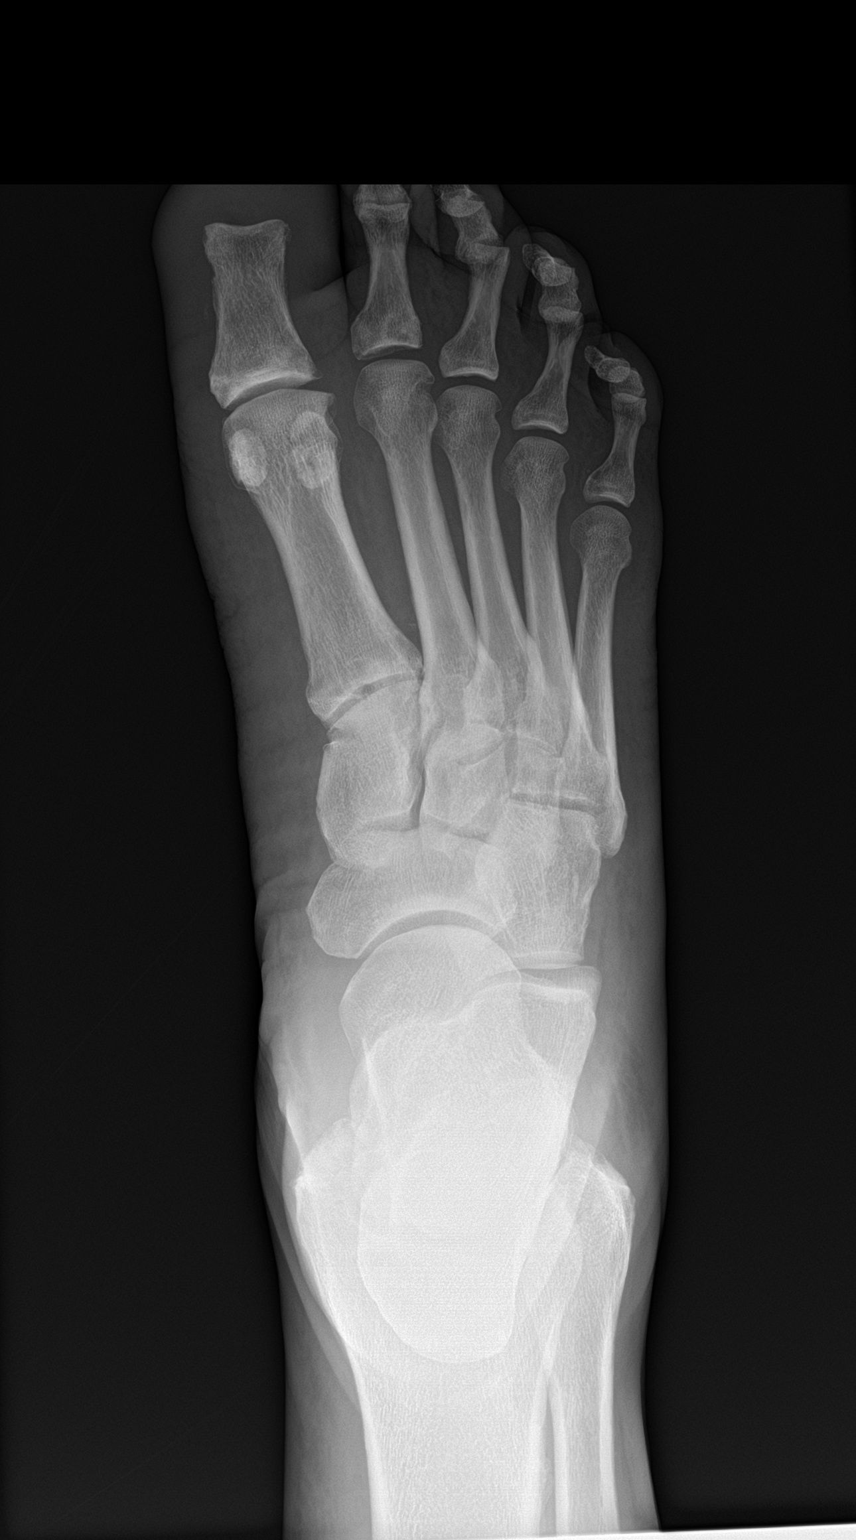

[foot obl]
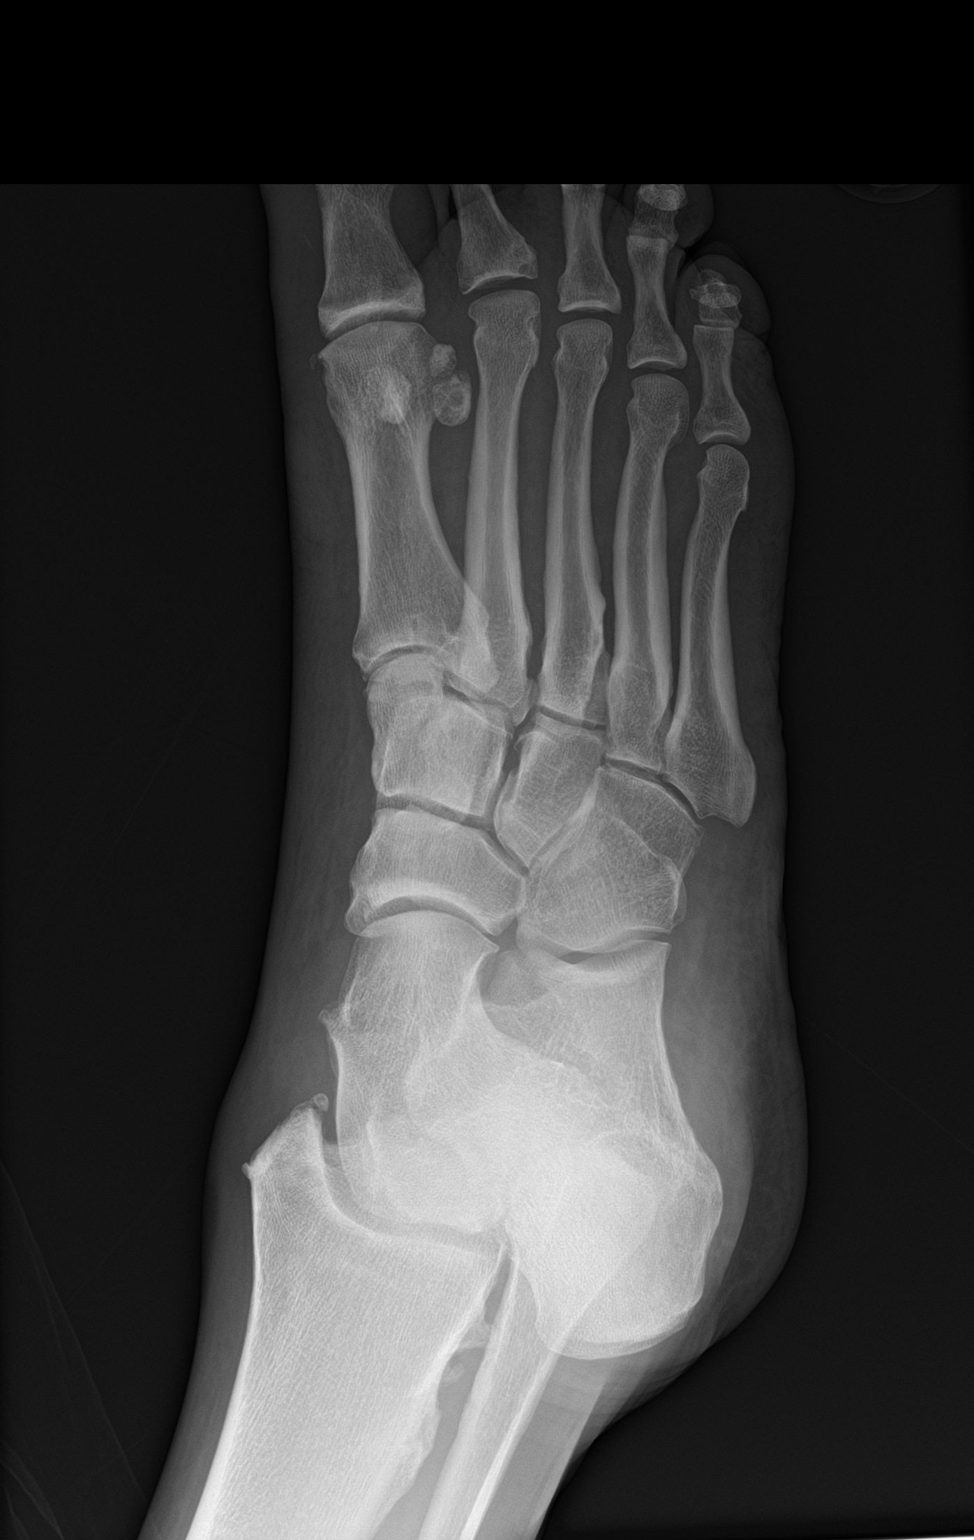

[foot lat]
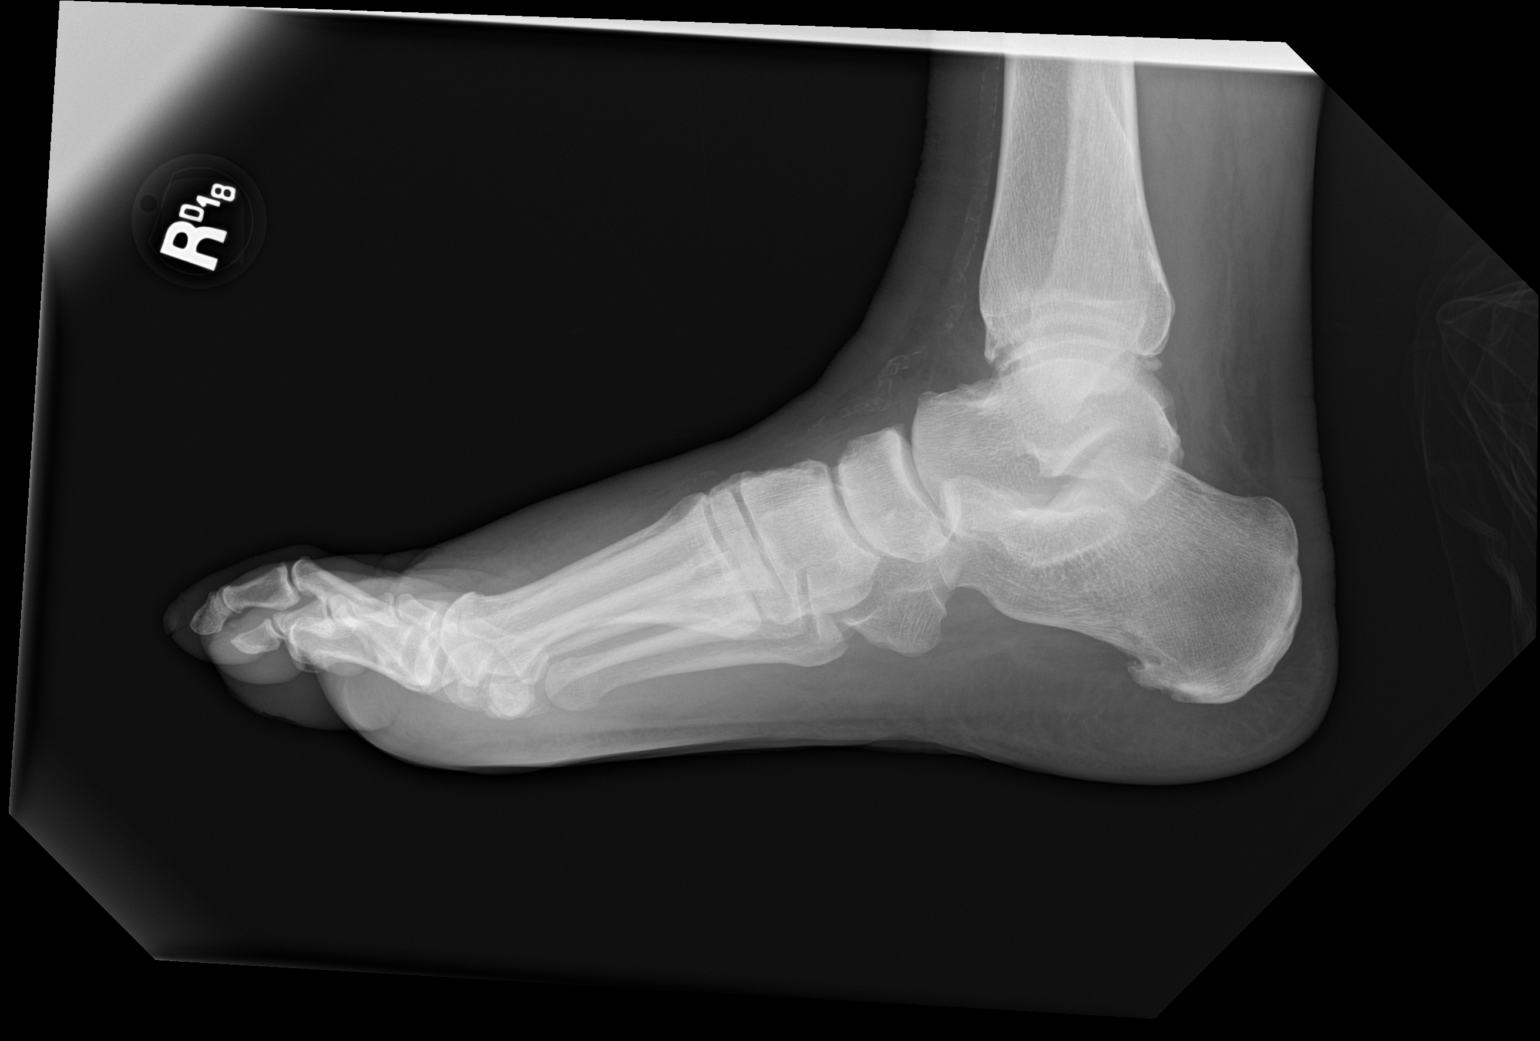

[3 of 3 positions shown; findings below may reference images not displayed]

FINDINGS: Partial amputation with absent distal phalanx of the first digit.
There is no erosion or periosteal reaction. Other joint spaces are
unremarkable. Vascular calcification.
IMPRESSION: No evidence of osteomyelitis by radiograph.

## 2021-04-23 SURGERY — IRRIGATION AND DEBRIDEMENT EXTREMITY
Anesthesia: General | Laterality: Right

## 2021-04-23 MED ORDER — DOCUSATE SODIUM 100 MG PO CAPS
100.0000 mg | ORAL_CAPSULE | Freq: Two times a day (BID) | ORAL | Status: DC
  Filled 2021-04-23: qty 1

## 2021-04-23 MED ORDER — PROPOFOL 10 MG/ML IV BOLUS
INTRAVENOUS | Status: AC
  Filled 2021-04-23: qty 20

## 2021-04-23 MED ORDER — ACETAMINOPHEN 650 MG RE SUPP: 650.0000 mg | Freq: Four times a day (QID) | RECTAL | Status: DC | PRN

## 2021-04-23 MED ORDER — ALUM & MAG HYDROXIDE-SIMETH 200-200-20 MG/5ML PO SUSP: 15.0000 mL | ORAL | Status: DC | PRN

## 2021-04-23 MED ORDER — JUVEN PO PACK
1.0000 | PACK | Freq: Two times a day (BID) | ORAL | Status: DC
  Administered 2021-04-24: 1 via ORAL
  Filled 2021-04-23: qty 1

## 2021-04-23 MED ORDER — SODIUM CHLORIDE 0.9 % IV SOLN
2.0000 g | INTRAVENOUS | Status: DC
  Filled 2021-04-23: qty 20

## 2021-04-23 MED ORDER — HYDRALAZINE HCL 20 MG/ML IJ SOLN
5.0000 mg | INTRAMUSCULAR | Status: DC | PRN
Start: 2021-04-23 — End: 2021-04-24

## 2021-04-23 MED ORDER — LACTATED RINGERS IV SOLN: INTRAVENOUS | Status: DC

## 2021-04-23 MED ORDER — PIPERACILLIN-TAZOBACTAM 3.375 G IVPB 30 MIN
3.3750 g | Freq: Once | INTRAVENOUS | Status: AC
  Administered 2021-04-23: 3.375 g via INTRAVENOUS
  Filled 2021-04-23: qty 50

## 2021-04-23 MED ORDER — VANCOMYCIN HCL IN DEXTROSE 1-5 GM/200ML-% IV SOLN
1000.0000 mg | Freq: Once | INTRAVENOUS | Status: AC
  Administered 2021-04-23: 1000 mg via INTRAVENOUS
  Filled 2021-04-23: qty 200

## 2021-04-23 MED ORDER — ENOXAPARIN SODIUM 30 MG/0.3ML IJ SOSY: 30.0000 mg | PREFILLED_SYRINGE | INTRAMUSCULAR | Status: DC

## 2021-04-23 MED ORDER — FENTANYL CITRATE (PF) 100 MCG/2ML IJ SOLN: 25.0000 ug | INTRAMUSCULAR | Status: DC | PRN

## 2021-04-23 MED ORDER — FINASTERIDE 5 MG PO TABS
5.0000 mg | ORAL_TABLET | Freq: Every day | ORAL | Status: DC
  Filled 2021-04-23: qty 1

## 2021-04-23 MED ORDER — MIDAZOLAM HCL 2 MG/2ML IJ SOLN
INTRAMUSCULAR | Status: DC | PRN
  Administered 2021-04-23: 2 mg via INTRAVENOUS

## 2021-04-23 MED ORDER — HYDROCODONE-ACETAMINOPHEN 5-325 MG PO TABS: 1.0000 | ORAL_TABLET | ORAL | Status: DC | PRN

## 2021-04-23 MED ORDER — INSULIN ASPART 100 UNIT/ML IJ SOLN: 0.0000 [IU] | Freq: Three times a day (TID) | INTRAMUSCULAR | Status: DC

## 2021-04-23 MED ORDER — ACETAMINOPHEN 325 MG PO TABS
650.0000 mg | ORAL_TABLET | Freq: Four times a day (QID) | ORAL | Status: DC | PRN
  Administered 2021-04-24: 650 mg via ORAL
  Filled 2021-04-23: qty 2

## 2021-04-23 MED ORDER — FENTANYL CITRATE (PF) 250 MCG/5ML IJ SOLN
INTRAMUSCULAR | Status: AC
  Filled 2021-04-23: qty 5

## 2021-04-23 MED ORDER — ONDANSETRON HCL 4 MG/2ML IJ SOLN
INTRAMUSCULAR | Status: DC | PRN
Start: 2021-04-23 — End: 2021-04-23
  Administered 2021-04-23: 4 mg via INTRAVENOUS

## 2021-04-23 MED ORDER — PROPOFOL 10 MG/ML IV BOLUS
INTRAVENOUS | Status: DC | PRN
  Administered 2021-04-23: 150 mg via INTRAVENOUS

## 2021-04-23 MED ORDER — MORPHINE SULFATE (PF) 2 MG/ML IV SOLN: 2.0000 mg | INTRAVENOUS | Status: DC | PRN

## 2021-04-23 MED ORDER — MAGNESIUM SULFATE 2 GM/50ML IV SOLN: 2.0000 g | Freq: Every day | INTRAVENOUS | Status: DC | PRN

## 2021-04-23 MED ORDER — PHENOL 1.4 % MT LIQD: 1.0000 | OROMUCOSAL | Status: DC | PRN

## 2021-04-23 MED ORDER — FENTANYL CITRATE (PF) 250 MCG/5ML IJ SOLN
INTRAMUSCULAR | Status: DC | PRN
  Administered 2021-04-23: 50 ug via INTRAVENOUS

## 2021-04-23 MED ORDER — INSULIN ASPART 100 UNIT/ML IJ SOLN: 0.0000 [IU] | INTRAMUSCULAR | Status: DC | PRN

## 2021-04-23 MED ORDER — CHLORHEXIDINE GLUCONATE 4 % EX LIQD: 60.0000 mL | Freq: Once | CUTANEOUS | Status: DC

## 2021-04-23 MED ORDER — INSULIN GLARGINE-YFGN 100 UNIT/ML ~~LOC~~ SOLN
24.0000 [IU] | Freq: Every day | SUBCUTANEOUS | Status: DC
  Administered 2021-04-24: 24 [IU] via SUBCUTANEOUS
  Filled 2021-04-23: qty 0.24

## 2021-04-23 MED ORDER — POLYETHYLENE GLYCOL 3350 17 G PO PACK: 17.0000 g | PACK | Freq: Every day | ORAL | Status: DC | PRN

## 2021-04-23 MED ORDER — ONDANSETRON HCL 4 MG/2ML IJ SOLN: 4.0000 mg | Freq: Four times a day (QID) | INTRAMUSCULAR | Status: DC | PRN

## 2021-04-23 MED ORDER — SORBITOL 70 % SOLN
30.0000 mL | Freq: Every day | Status: DC | PRN
  Filled 2021-04-23: qty 30

## 2021-04-23 MED ORDER — VANCOMYCIN HCL 500 MG IV SOLR
INTRAVENOUS | Status: DC | PRN
  Administered 2021-04-23: 500 mg

## 2021-04-23 MED ORDER — LABETALOL HCL 5 MG/ML IV SOLN: 10.0000 mg | INTRAVENOUS | Status: DC | PRN

## 2021-04-23 MED ORDER — CHLORHEXIDINE GLUCONATE 0.12 % MT SOLN: 15.0000 mL | Freq: Once | OROMUCOSAL | Status: AC

## 2021-04-23 MED ORDER — METRONIDAZOLE 500 MG/100ML IV SOLN
500.0000 mg | Freq: Two times a day (BID) | INTRAVENOUS | Status: DC
  Administered 2021-04-23 – 2021-04-24 (×2): 500 mg via INTRAVENOUS
  Filled 2021-04-23 (×2): qty 100

## 2021-04-23 MED ORDER — MIDAZOLAM HCL 2 MG/2ML IJ SOLN
INTRAMUSCULAR | Status: AC
  Filled 2021-04-23: qty 2

## 2021-04-23 MED ORDER — OXYCODONE HCL 5 MG PO TABS: 5.0000 mg | ORAL_TABLET | ORAL | Status: DC | PRN

## 2021-04-23 MED ORDER — ASCORBIC ACID 500 MG PO TABS
1000.0000 mg | ORAL_TABLET | Freq: Every day | ORAL | Status: DC
  Administered 2021-04-24: 1000 mg via ORAL
  Filled 2021-04-23: qty 2

## 2021-04-23 MED ORDER — RISAQUAD PO CAPS
1.0000 | ORAL_CAPSULE | Freq: Every day | ORAL | Status: DC
  Administered 2021-04-24: 1 via ORAL
  Filled 2021-04-23: qty 1

## 2021-04-23 MED ORDER — VANCOMYCIN HCL 500 MG IV SOLR
INTRAVENOUS | Status: AC
  Filled 2021-04-23: qty 10

## 2021-04-23 MED ORDER — MAGNESIUM CITRATE PO SOLN
1.0000 | Freq: Once | ORAL | Status: DC | PRN
Start: 2021-04-23 — End: 2021-04-24
  Filled 2021-04-23: qty 296

## 2021-04-23 MED ORDER — BISACODYL 5 MG PO TBEC: 5.0000 mg | DELAYED_RELEASE_TABLET | Freq: Every day | ORAL | Status: DC | PRN

## 2021-04-23 MED ORDER — INSULIN ASPART 100 UNIT/ML IJ SOLN: 0.0000 [IU] | Freq: Every day | INTRAMUSCULAR | Status: DC

## 2021-04-23 MED ORDER — GUAIFENESIN-DM 100-10 MG/5ML PO SYRP: 15.0000 mL | ORAL_SOLUTION | ORAL | Status: DC | PRN

## 2021-04-23 MED ORDER — HYDROCODONE-ACETAMINOPHEN 7.5-325 MG PO TABS: 1.0000 | ORAL_TABLET | ORAL | Status: DC | PRN

## 2021-04-23 MED ORDER — CHLORHEXIDINE GLUCONATE 0.12 % MT SOLN
OROMUCOSAL | Status: AC
  Administered 2021-04-23: 15 mL via OROMUCOSAL
  Filled 2021-04-23: qty 15

## 2021-04-23 MED ORDER — LIDOCAINE 2% (20 MG/ML) 5 ML SYRINGE
INTRAMUSCULAR | Status: DC | PRN
  Administered 2021-04-23: 100 mg via INTRAVENOUS

## 2021-04-23 MED ORDER — SODIUM CHLORIDE 0.9 % IR SOLN
Status: DC | PRN
Start: 2021-04-23 — End: 2021-04-23
  Administered 2021-04-23: 4000 mL

## 2021-04-23 MED ORDER — MORPHINE SULFATE (PF) 2 MG/ML IV SOLN: 0.5000 mg | INTRAVENOUS | Status: DC | PRN

## 2021-04-23 MED ORDER — EPHEDRINE SULFATE-NACL 50-0.9 MG/10ML-% IV SOSY
PREFILLED_SYRINGE | INTRAVENOUS | Status: DC | PRN
  Administered 2021-04-23: 5 mg via INTRAVENOUS
  Administered 2021-04-23: 10 mg via INTRAVENOUS

## 2021-04-23 MED ORDER — SODIUM CHLORIDE 0.9 % IV SOLN: INTRAVENOUS | Status: DC

## 2021-04-23 MED ORDER — CEFAZOLIN SODIUM-DEXTROSE 2-4 GM/100ML-% IV SOLN
2.0000 g | INTRAVENOUS | Status: AC
  Administered 2021-04-23: 2 g via INTRAVENOUS

## 2021-04-23 MED ORDER — PANTOPRAZOLE SODIUM 40 MG PO TBEC
40.0000 mg | DELAYED_RELEASE_TABLET | Freq: Every day | ORAL | Status: DC
  Administered 2021-04-24: 40 mg via ORAL
  Filled 2021-04-23: qty 1

## 2021-04-23 MED ORDER — AMISULPRIDE (ANTIEMETIC) 5 MG/2ML IV SOLN: 10.0000 mg | Freq: Once | INTRAVENOUS | Status: DC | PRN

## 2021-04-23 MED ORDER — POTASSIUM CHLORIDE CRYS ER 20 MEQ PO TBCR: 20.0000 meq | EXTENDED_RELEASE_TABLET | Freq: Every day | ORAL | Status: DC | PRN

## 2021-04-23 MED ORDER — ORAL CARE MOUTH RINSE: 15.0000 mL | Freq: Once | OROMUCOSAL | Status: AC

## 2021-04-23 MED ORDER — POVIDONE-IODINE 10 % EX SWAB: 2.0000 "application " | Freq: Once | CUTANEOUS | Status: DC

## 2021-04-23 MED ORDER — PROCHLORPERAZINE EDISYLATE 10 MG/2ML IJ SOLN: 10.0000 mg | Freq: Once | INTRAMUSCULAR | Status: DC

## 2021-04-23 MED ORDER — ONDANSETRON HCL 4 MG PO TABS: 4.0000 mg | ORAL_TABLET | Freq: Four times a day (QID) | ORAL | Status: DC | PRN

## 2021-04-23 SURGICAL SUPPLY — 49 items
BAG COUNTER SPONGE SURGICOUNT (BAG) ×2 IMPLANT
BAG SPNG CNTER NS LX DISP (BAG) ×1
BANDAGE ESMARK 6X9 LF (GAUZE/BANDAGES/DRESSINGS) ×1 IMPLANT
BLADE SURG 10 STRL SS (BLADE) ×2 IMPLANT
BNDG CMPR 9X6 STRL LF SNTH (GAUZE/BANDAGES/DRESSINGS) ×1
BNDG COHESIVE 4X5 TAN STRL (GAUZE/BANDAGES/DRESSINGS) ×2 IMPLANT
BNDG COHESIVE 6X5 TAN STRL LF (GAUZE/BANDAGES/DRESSINGS) ×2 IMPLANT
BNDG CONFORM 3 STRL LF (GAUZE/BANDAGES/DRESSINGS) ×2 IMPLANT
BNDG ELASTIC 4X5.8 VLCR STR LF (GAUZE/BANDAGES/DRESSINGS) ×1 IMPLANT
BNDG ESMARK 6X9 LF (GAUZE/BANDAGES/DRESSINGS) ×2
BNDG GAUZE ELAST 4 BULKY (GAUZE/BANDAGES/DRESSINGS) ×1 IMPLANT
CANISTER SUCT 3000ML PPV (MISCELLANEOUS) ×2 IMPLANT
CHLORAPREP W/TINT 26 (MISCELLANEOUS) ×2 IMPLANT
COVER SURGICAL LIGHT HANDLE (MISCELLANEOUS) ×2 IMPLANT
CUFF TOURN SGL QUICK 34 (TOURNIQUET CUFF) ×2
CUFF TOURN SGL QUICK 42 (TOURNIQUET CUFF) IMPLANT
CUFF TRNQT CYL 34X4.125X (TOURNIQUET CUFF) ×1 IMPLANT
DRSG EMULSION OIL 3X3 NADH (GAUZE/BANDAGES/DRESSINGS) ×1 IMPLANT
DRSG MEPITEL 4X7.2 (GAUZE/BANDAGES/DRESSINGS) ×2 IMPLANT
DRSG PAD ABDOMINAL 8X10 ST (GAUZE/BANDAGES/DRESSINGS) ×2 IMPLANT
ELECT REM PT RETURN 9FT ADLT (ELECTROSURGICAL) ×2
ELECTRODE REM PT RTRN 9FT ADLT (ELECTROSURGICAL) ×1 IMPLANT
EVACUATOR SILICONE 100CC (DRAIN) IMPLANT
GAUZE SPONGE 4X4 12PLY STRL (GAUZE/BANDAGES/DRESSINGS) ×1 IMPLANT
GLOVE SRG 8 PF TXTR STRL LF DI (GLOVE) ×2 IMPLANT
GLOVE SURG ENC MOIS LTX SZ8 (GLOVE) ×2 IMPLANT
GLOVE SURG LTX SZ8 (GLOVE) ×2 IMPLANT
GLOVE SURG UNDER POLY LF SZ8 (GLOVE) ×4
GOWN STRL REUS W/ TWL XL LVL3 (GOWN DISPOSABLE) ×2 IMPLANT
GOWN STRL REUS W/TWL XL LVL3 (GOWN DISPOSABLE) ×4
KIT BASIN OR (CUSTOM PROCEDURE TRAY) ×2 IMPLANT
KIT TURNOVER KIT B (KITS) ×2 IMPLANT
NS IRRIG 1000ML POUR BTL (IV SOLUTION) ×2 IMPLANT
PACK ORTHO EXTREMITY (CUSTOM PROCEDURE TRAY) ×2 IMPLANT
PAD ARMBOARD 7.5X6 YLW CONV (MISCELLANEOUS) ×4 IMPLANT
PAD CAST 4YDX4 CTTN HI CHSV (CAST SUPPLIES) IMPLANT
PADDING CAST COTTON 4X4 STRL (CAST SUPPLIES) ×2
SET CYSTO W/LG BORE CLAMP LF (SET/KITS/TRAYS/PACK) ×4 IMPLANT
SPONGE T-LAP 4X18 ~~LOC~~+RFID (SPONGE) ×2 IMPLANT
SUCTION FRAZIER HANDLE 10FR (MISCELLANEOUS) ×2
SUCTION TUBE FRAZIER 10FR DISP (MISCELLANEOUS) ×1 IMPLANT
SUT ETHILON 2 0 FS 18 (SUTURE) ×1 IMPLANT
SUT ETHILON 3 0 PS 1 (SUTURE) IMPLANT
SUT VIC AB 2-0 CT1 27 (SUTURE)
SUT VIC AB 2-0 CT1 TAPERPNT 27 (SUTURE) IMPLANT
TOWEL GREEN STERILE (TOWEL DISPOSABLE) ×2 IMPLANT
TOWEL GREEN STERILE FF (TOWEL DISPOSABLE) ×2 IMPLANT
TUBE CONNECTING 12X1/4 (SUCTIONS) ×2 IMPLANT
YANKAUER SUCT BULB TIP NO VENT (SUCTIONS) ×2 IMPLANT

## 2021-04-23 NOTE — Assessment & Plan Note (Signed)
-  Baseline creatinine is 2-2.2 according to neurology ?-He is minimally higher than that today ?-Again, we discussed the importance of knowing his baseline creatinine and doing everything possible to keep HTN and DM well controlled in order to maintain renal function ?-Resume Farxiga at the time of dc ?

## 2021-04-23 NOTE — Anesthesia Postprocedure Evaluation (Signed)
Anesthesia Post Note ? ?Patient: [name] [name] ? ?Procedure(s) Performed: AMPUTATION RIGHT GREAT TOE (Right) ? ?  ? ?Patient location during evaluation: PACU ?Anesthesia Type: General ?Level of consciousness: sedated ?Pain management: pain level controlled ?Vital Signs Assessment: post-procedure vital signs reviewed and stable ?Respiratory status: spontaneous breathing and respiratory function stable ?Cardiovascular status: stable ?Postop Assessment: no apparent nausea or vomiting ?Anesthetic complications: no ? ? ?No notable events documented. ? ?Last Vitals:  ?Vitals:  ? 04/23/21 2000 04/23/21 2013  ?BP: (!) 162/75 (!) 153/79  ?Pulse: 78 78  ?Resp: 15 20  ?Temp: 36.7 ?C 36.4 ?C  ?SpO2: 96% 96%  ?  ?Last Pain:  ?Vitals:  ? 04/23/21 2000  ?TempSrc:   ?PainSc: 0-No pain  ? ? ?  ?  ?  ?  ?  ?  ? ?Mellissa Conley DANIEL ? ? ? ? ?

## 2021-04-23 NOTE — Op Note (Signed)
04/23/2021 ? ?6:50 PM ? ?PATIENT:  [name] [name]  66 y.o. male ? ?PRE-OPERATIVE DIAGNOSIS: 1.  Right hallux diabetic foot ulcer ?2.  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 ?POST-OPERATIVE DIAGNOSIS: Same ? ?Procedure(s): Right hallux amputation through the metatarsal phalangeal joint ? ?SURGEON:  Toni Arthurs, MD ? ?ASSISTANT: None ? ?ANESTHESIA:   General ? ?EBL:  minimal  ? ?TOURNIQUET: Approximately 15 minutes with an ankle Esmarch ? ?COMPLICATIONS:  None apparent ? ?DISPOSITION:  Extubated, awake and stable to recovery. ? ?INDICATION FOR PROCEDURE: The patient is a 66 year old male with a past medical history significant for type 2 diabetes complicated by peripheral neuropathy.  He underwent right hallux amputation through the IP joint just over 3 months ago.  Last night he developed drainage from the distal lateral end of the stump, and this morning had redness, warmth and swelling involving the toe and dorsum of the foot.  He presented to the emergency room.  Sed rate and CRP were elevated.  An MRI shows a deep abscess of the distal end of the toe as well as enhancement of the proximal phalanx consistent with osteomyelitis.  He presents now for right hallux amputation through the MTP joint.  Cultures were obtained in the emergency room prior to starting IV antibiotics.  The risks and benefits of the alternative treatment options have been discussed in detail.  The patient wishes to proceed with surgery and specifically understands risks of bleeding, infection, nerve damage, blood clots, need for additional surgery, amputation and death.  ? ?PROCEDURE IN DETAIL:  After pre operative consent was obtained, and the correct operative site was identified, the patient was brought to the operating room and placed supine on the OR table.  Anesthesia was administered.  Pre-operative antibiotics were administered.  A surgical timeout was taken.  The right lower extremity was prepped and draped in  standard sterile fashion.  The foot was exsanguinated and a 4 inch Esmarch tourniquet wrapped around the ankle.  A racquet style incision was marked on the skin at the base of the hallux.  The incision was made and dissection carried sharply down through the subcutaneous tissues to the proximal phalanx.  Subperiosteal dissection was then carried proximally to the MTP joint.  The toe was disarticulated through the MTP joint and passed off the field.  On the back table the distal end of the toe was incised at the area of drainage.  Purulent deep tissue was obtained with a rondure and sent as a specimen to microbiology for aerobic and anaerobic culture.  The abscess was several cc in volume and completely enveloped the head of the proximal phalanx. ? ?Attention was returned to the surgical field where the soft tissues were carefully examined at the operative site.  All soft tissues appeared healthy with no evidence of necrosis or purulence.  The wound was then irrigated copiously with 3 L of normal saline.  Neurovascular bundles were cauterized.  The wound was sprinkled with vancomycin.  The incision was closed with simple and horizontal mattress sutures of 2-0 nylon.  Sterile dressings were applied followed by a compression wrap.  The tourniquet was released after application of the dressings.  The patient was awakened from anesthesia and transported to the recovery room in stable condition. ? ?FOLLOW UP PLAN: Weightbearing as tolerated in a flat postop shoe.  ID consultation in the morning to follow-up cultures and plan for postoperative antibiotics.  No indication for DVT prophylaxis in this ambulatory  patient. ?

## 2021-04-23 NOTE — Anesthesia Preprocedure Evaluation (Signed)
Anesthesia Evaluation  ?Patient identified by MRN, date of birth, ID band ?Patient awake ? ? ? ?Reviewed: ?Allergy & Precautions, NPO status , Patient's Chart, lab work & pertinent test results ? ?History of Anesthesia Complications ?Negative for: history of anesthetic complications ? ?Airway ?Mallampati: I ? ?TM Distance: >3 FB ?Neck ROM: Full ? ? ? Dental ? ?(+) Dental Advisory Given, Teeth Intact ?  ?Pulmonary ?neg pulmonary ROS,  ?  ?breath sounds clear to auscultation ? ? ? ? ? ? Cardiovascular ?hypertension, Pt. on medications ? ?Rhythm:Regular  ? ?  ?Neuro/Psych ?negative neurological ROS ? negative psych ROS  ? GI/Hepatic ?negative GI ROS,   ?Endo/Other  ?diabetes, Insulin Dependent ? Renal/GU ?Renal InsufficiencyRenal disease  ? ?  ?Musculoskeletal ?Right foot diabetic ulcer  ? Abdominal ?  ?Peds ? Hematology ?negative hematology ROS ?(+)   ?Anesthesia Other Findings ? ? Reproductive/Obstetrics ? ?  ? ? ? ? ? ? ? ? ? ? ? ? ? ?  ?  ? ? ? ? ? ? ? ? ?Anesthesia Physical ? ?Anesthesia Plan ? ?ASA: 3 ? ?Anesthesia Plan: General  ? ?Post-op Pain Management: Minimal or no pain anticipated and Ofirmev IV (intra-op)*  ? ?Induction: Intravenous ? ?PONV Risk Score and Plan: 2 and Treatment may vary due to age or medical condition and Ondansetron ? ?Airway Management Planned:  ? ?Additional Equipment: None ? ?Intra-op Plan:  ? ?Post-operative Plan: Extubation in OR ? ?Informed Consent: I have reviewed the patients History and Physical, chart, labs and discussed the procedure including the risks, benefits and alternatives for the proposed anesthesia with the patient or authorized representative who has indicated his/her understanding and acceptance.  ? ? ? ?Dental advisory given ? ?Plan Discussed with: CRNA and Anesthesiologist ? ?Anesthesia Plan Comments:   ? ? ? ? ? ? ?Anesthesia Quick Evaluation ? ?

## 2021-04-23 NOTE — Anesthesia Procedure Notes (Signed)
Procedure Name: LMA Insertion ?Date/Time: 04/23/2021 6:18 PM ?Performed by: Elliot Dally, CRNA ?Pre-anesthesia Checklist: Patient identified, Emergency Drugs available, Suction available and Patient being monitored ?Patient Re-evaluated:Patient Re-evaluated prior to induction ?Oxygen Delivery Method: Circle System Utilized ?Preoxygenation: Pre-oxygenation with 100% oxygen ?Induction Type: IV induction ?Ventilation: Mask ventilation without difficulty ?LMA: LMA inserted ?LMA Size: 4.0 ?Number of attempts: 1 ?Airway Equipment and Method: Bite block ?Placement Confirmation: positive ETCO2 ?Tube secured with: Tape ?Dental Injury: Teeth and Oropharynx as per pre-operative assessment  ? ? ? ? ?

## 2021-04-23 NOTE — Assessment & Plan Note (Signed)
-  Prior amputation in December of just the R distal great toe ?-He developed a plantar ulcer on the base of the remaining R great toe and has bene closely monitored for this ?-Foot ulcer is present dorsally and draining and now the patient has developed surrounding cellulitis ?-Negative lactate, no current concerns for sepsis ?-Will treat with IV antibiotics (Rocephin/Flagyl as per the lower extremity wound algorithm) ?-Orthopedics will consult ?-Based on refractory ulcer infection with osteitis, it seems that the patient may be heading for additional amputation ?-He has great distal pulses and so ABIs do not appear to be indicated ?-Patient is NPO in case he needs a procedure ?-LE wound order set utilized including labs (CRP, ESR, A1c, prealbumin, HIV, and blood cultures) and consults (diabetes coordinator; peripheral vascular navigator; TOC team; wound care; and nutrition) ?

## 2021-04-23 NOTE — Assessment & Plan Note (Signed)
-  His only home BP medication appears to be [place], although he also takes Lasix ?-Will hold both ?-Will cover with prn IV hydralazine ?

## 2021-04-23 NOTE — Assessment & Plan Note (Signed)
-  Continue PPI (Protonix for Nexium due to formulary) ?-Surveillance EGD is scheduled for May ?

## 2021-04-23 NOTE — Transfer of Care (Signed)
Immediate Anesthesia Transfer of Care Note ? ?Patient: [name] [name] ? ?Procedure(s) Performed: AMPUTATION RIGHT GREAT TOE (Right) ? ?Patient Location: PACU ? ?Anesthesia Type:General ? ?Level of Consciousness: awake and sedated ? ?Airway & Oxygen Therapy: Patient Spontanous Breathing ? ?Post-op Assessment: Report given to RN and Post -op Vital signs reviewed and stable ? ?Post vital signs: Reviewed and stable ? ?Last Vitals:  ?Vitals Value Taken Time  ?BP 129/66 04/23/21 1912  ?Temp 97.9   ?Pulse 78 04/23/21 1913  ?Resp 15 04/23/21 1913  ?SpO2 94 % 04/23/21 1913  ?Vitals shown include unvalidated device data. ? ?Last Pain:  ?Vitals:  ? 04/23/21 1730  ?TempSrc:   ?PainSc: 0-No pain  ?   ? ?  ? ?Complications: No notable events documented. ?

## 2021-04-23 NOTE — Assessment & Plan Note (Signed)
-  Will check A1c; the importance of knowing his A1c and maintaining it below 7 cannot be stressed enough and this was discussed at length with the patient and his wife ?-hold [place] ?-Continue glargine (Sem-glee instead of Tresiba, per formulary) ?-Cover with moderate-scale SSI ?-Consult to diabetes coordinator ?

## 2021-04-23 NOTE — ED Notes (Signed)
Pt stated that he had The Rt great toe amputated back in December. He was "on the ball field" yesterday & when he came home and took his shoe off he saw "puss & blood" coming out of that toe. He cleaned/covered it & then came in to be seen this morning.  ?

## 2021-04-23 NOTE — Progress Notes (Signed)
Called wife Jacki Cones to update, no answer. ?

## 2021-04-23 NOTE — Progress Notes (Deleted)
Inpatient Diabetes Program Recommendations ? ?AACE/ADA: New Consensus Statement on Inpatient Glycemic Control (2015) ? ?Target Ranges:  Prepandial:   less than 140 mg/dL ?     Peak postprandial:   less than 180 mg/dL (1-2 hours) ?     Critically ill patients:  140 - 180 mg/dL  ? ? ? ?Review of Glycemic Control ? Latest Reference Range & Units 04/23/21 09:29  ?Glucose 70 - 99 mg/dL 242 (H)  ?(H): Data is abnormally high ? ?Diabetes history: DM2 ?Outpatient Diabetes medications: Tresiba 24 units QD, Farxiga QD ?Current orders for Inpatient glycemic control: Semglee 24 units QD, Novolog 0-15 units TID and 0-5 units QHS ? ?Inpatient Diabetes Program Recommendations:   ? ?NPO for MRI/procedure  ? ?Please consider decreasing basal to Semglee 18 units QD ? ?Will continue to follow while inpatient. ? ?Thank you, ?Dulce Sellar, MSN, RN ?Diabetes Coordinator ?Inpatient Diabetes Program ?[phone] (team pager from 8a-5p) ? ? ? ?

## 2021-04-23 NOTE — Consult Note (Addendum)
Reason for Consult:Right toe infection ?Referring Physician: Karmen Bongo ?Time called: [id number] ?Time at bedside: 1246 ? ? ?[name] [name] is an 66 y.o. male.  ?HPI: Chanc comes in with a right great toe infection. He was in his usual state of health until he took his shoe off last night and his toe began draining pus. He has been treating an ulceration on the pad of the toe but that has been doing better. He denies pain, fevers, chills, sweats, or N/V. ? ?Past Medical History:  ?Diagnosis Date  ? Diabetes mellitus without complication ([place])   ? Gout   ? MVP (mitral valve prolapse)   ? Ulcer of foot due to diabetes [facility])   ? ? ?Past Surgical History:  ?Procedure Laterality Date  ? AMPUTATION TOE Right 01/20/2021  ? Procedure: Right hallux amputation through the proximal phalanx;  Surgeon: Wylene Simmer, MD;  Location: [facility];  Service: Orthopedics;  Laterality: Right;  80min  ? NO PAST SURGERIES    ? ? ?History reviewed. No pertinent family history. ? ?Social History:  reports that he has never smoked. He has never used smokeless tobacco. He reports that he does not drink alcohol and does not use drugs. ? ?Allergies:  ?Allergies  ?Allergen Reactions  ? Erythromycin Hives  ? ? ?Medications: I have reviewed the patient's current medications. ? ?Results for orders placed or performed during the hospital encounter of 04/23/21 (from the past 48 hour(s))  ?Basic metabolic panel     Status: Abnormal  ? Collection Time: 04/23/21  9:29 AM  ?Result Value Ref Range  ? Sodium 136 135 - 145 mmol/L  ? Potassium 4.2 3.5 - 5.1 mmol/L  ? Chloride 105 98 - 111 mmol/L  ? CO2 22 22 - 32 mmol/L  ? Glucose, Bld 157 (H) 70 - 99 mg/dL  ?  Comment: Glucose reference range applies only to samples taken after fasting for at least 8 hours.  ? BUN 46 (H) 8 - 23 mg/dL  ? Creatinine, Ser 2.32 (H) 0.61 - 1.24 mg/dL  ? Calcium 9.0 8.9 - 10.3 mg/dL  ? GFR, Estimated 30 (L) >60 mL/min  ?  Comment: (NOTE) ?Calculated using the CKD-EPI  Creatinine Equation (2021) ?  ? Anion gap 9 5 - 15  ?  Comment: Performed at [facility], [place] [address], [place], [place] [address]  ?CBC with Differential     Status: Abnormal  ? Collection Time: 04/23/21  9:29 AM  ?Result Value Ref Range  ? WBC 9.2 4.0 - 10.5 K/uL  ? RBC 4.43 4.22 - 5.81 MIL/uL  ? Hemoglobin 13.1 13.0 - 17.0 g/dL  ? HCT 38.2 (L) 39.0 - 52.0 %  ? MCV 86.2 80.0 - 100.0 fL  ? MCH 29.6 26.0 - 34.0 pg  ? MCHC 34.3 30.0 - 36.0 g/dL  ? RDW 12.3 11.5 - 15.5 %  ? Platelets 195 150 - 400 K/uL  ? nRBC 0.0 0.0 - 0.2 %  ? Neutrophils Relative % 71 %  ? Neutro Abs 6.4 1.7 - 7.7 K/uL  ? Lymphocytes Relative 20 %  ? Lymphs Abs 1.9 0.7 - 4.0 K/uL  ? Monocytes Relative 8 %  ? Monocytes Absolute 0.8 0.1 - 1.0 K/uL  ? Eosinophils Relative 1 %  ? Eosinophils Absolute 0.1 0.0 - 0.5 K/uL  ? Basophils Relative 0 %  ? Basophils Absolute 0.0 0.0 - 0.1 K/uL  ? Immature Granulocytes 0 %  ? Abs Immature Granulocytes  0.02 0.00 - 0.07 K/uL  ?  Comment: Performed at [facility], [place] [address], [place], [place] [address]  ?Sedimentation rate     Status: Abnormal  ? Collection Time: 04/23/21  9:29 AM  ?Result Value Ref Range  ? Sed Rate 79 (H) 0 - 16 mm/hr  ?  Comment: Performed at [facility], [place] [address], [place], [place] [address]  ?C-reactive protein     Status: Abnormal  ? Collection Time: 04/23/21  9:29 AM  ?Result Value Ref Range  ? CRP 7.9 (H) <1.0 mg/dL  ?  Comment: Performed at [facility], [place] [address], [place], [place] [address]  ? ? ?DG Foot Complete Right ? ?Result Date: 04/23/2021 ?CLINICAL DATA:  Partial amputation patent tail with nonhealing sore EXAM: RIGHT FOOT COMPLETE - 3+ VIEW COMPARISON:  None. FINDINGS: Partial amputation with absent distal phalanx of the first digit. There is no erosion or periosteal reaction. Other joint spaces are unremarkable. Vascular calcification. IMPRESSION: No evidence of osteomyelitis by radiograph. Electronically Signed   By: Macy Mis M.D.    On: 04/23/2021 10:52   ? ?Review of Systems  ?Constitutional:  Negative for chills, diaphoresis and fever.  ?HENT:  Negative for ear discharge, ear pain, hearing loss and tinnitus.   ?Eyes:  Negative for photophobia and pain.  ?Respiratory:  Negative for cough and shortness of breath.   ?Cardiovascular:  Negative for chest pain.  ?Gastrointestinal:  Negative for abdominal pain, nausea and vomiting.  ?Genitourinary:  Negative for dysuria, flank pain, frequency and urgency.  ?Musculoskeletal:  Negative for arthralgias, back pain, myalgias and neck pain.  ?Skin:  Positive for wound (Right great toe).  ?Neurological:  Negative for dizziness and headaches.  ?Hematological:  Does not bruise/bleed easily.  ?Psychiatric/Behavioral:  The patient is not nervous/anxious.   ?Blood pressure (!) 158/89, pulse 84, temperature 98.3 ?F (36.8 ?C), temperature source Oral, resp. rate 18, height 6\' 1"  (1.854 m), weight 88.9 kg, SpO2 100 %. ?Physical Exam ?Constitutional:   ?   General: He is not in acute distress. ?   Appearance: He is well-developed. He is not diaphoretic.  ?HENT:  ?   Head: Normocephalic and atraumatic.  ?Eyes:  ?   General: No scleral icterus.    ?   Right eye: No discharge.     ?   Left eye: No discharge.  ?   Conjunctiva/sclera: Conjunctivae normal.  ?Cardiovascular:  ?   Rate and Rhythm: Normal rate and regular rhythm.  ?Pulmonary:  ?   Effort: Pulmonary effort is normal. No respiratory distress.  ?Musculoskeletal:  ?   Cervical back: Normal range of motion.  ?   Comments: Right foot: Great toe with fusiform edema, erythema spreading to forefoot, benign appearing ulceration plantar toe, new ulceration with bloody purulent discharge lateral aspect, 1+ DP pulse.  ?Skin: ?   General: Skin is warm and dry.  ?Neurological:  ?   Mental Status: He is alert.  ?Psychiatric:     ?   Mood and Affect: Mood normal.     ?   Behavior: Behavior normal.  ? ? ?Assessment/Plan: ?Right great toe infection -- Will get MRI to r/o  osteo. Plan I&D vs revision this afternoon with Dr. Doran Durand. Please keep NPO. ? ? ? ?Lisette Abu, PA-C ?Orthopedic Surgery ?[phone] ?04/23/2021, 1:13 PM  ? ?Addendum:  Pt seen and examined.  Agree with findings documented above.  MRI shows abscess and osteomyelitis of the  proximal phalanx.   ? ?A:  diabetic ulcer right hallux with cellulitis, abscess and osteomyelitis.   ?P:  To the OR today for revision amputation through the MTP joint. ? ?I explained the nature of the condition in detail.  We discussed the risks and benefits of the surgery in detail.  I have reassured Mr. Middendorf that I will not amputate more than the toe at this point.  I explained that there is always the possibility of needing further surgery on the foot to include the possibility of revision amputation. ? ? ?

## 2021-04-23 NOTE — ED Provider Notes (Addendum)
?[place] ?Provider Note ? ? ?CSN: [id number] ?Arrival date & time: 04/23/21  0901 ? ?  ? ?History ? ?Chief Complaint  ?Patient presents with  ? Rt Foot Infection  ? ? ?[name] [name] is a 66 y.o. male. ? ?[name] [name] is a 65 year-old-male with a pertinent history of diabetes mellitus, diabetic foot ulcers, gout, and s/p partial ambulation of the right great toe in December 2022 who presents to the ED with concerns of right great toe infection. Patient states that last night (04/22/2021), his right great toe suddenly expressed moderate amounts of white-yellow pus and blood. He did not notice the infection prior and denies any fever, pain, or difficulty ambulating. Patient expressed all contents, cleaned the wound with betadine, and bandaged the wound. The patient states that he noticed that the wound re-filled with pus this morning (04/23/2021) and that his right great toe and right midfoot became very erythematous and edematous. He contacted his orthopedic surgeon, Dr. Doran Durand who advised him to come to the ED. Patient does not know his most recent A1C but states his blood glucose has been recently well controlled in the ranges of 100s-120s. He denies any precipitating trauma or any prior similar episodes to these symptoms. He denies any fever, chills, chest pain, shortness of breath, abdominal pain, N/V/D/C, arthralgias, myalgias. Patient states that he is also being treated for a pressure ulcer on the pad of his right large toe for the past 5 months.  ? ?The history is provided by the patient, the spouse and medical records.  ? ?  ? ?Home Medications ?Prior to Admission medications   ?Medication Sig Start Date End Date Taking? Authorizing Provider  ?colchicine 0.6 MG tablet Take 0.6 mg by mouth daily.    [provider]  ?dapagliflozin propanediol (FARXIGA) 10 MG TABS tablet Take 10 mg by mouth daily.    [provider]  ?finasteride (PROSCAR) 5 MG tablet Take  5 mg by mouth daily.    [provider]  ?furosemide (LASIX) 40 MG tablet Take 40 mg by mouth.    [provider]  ?insulin degludec (TRESIBA FLEXTOUCH) 200 UNIT/ML FlexTouch Pen Inject 24 Units into the skin daily.    [provider]  ?tadalafil (CIALIS) 5 MG tablet Take 5 mg by mouth daily.    [provider]  ?   ? ?Allergies    ?Azithromycin   ? ?Review of Systems   ?Review of Systems  ?Constitutional:  Negative for chills and fever.  ?HENT: Negative.    ?Respiratory:  Negative for cough and shortness of breath.   ?Cardiovascular:  Negative for chest pain.  ?Gastrointestinal:  Negative for abdominal pain, nausea and vomiting.  ?Musculoskeletal:  Positive for arthralgias.  ?Skin:  Positive for color change and wound.  ?Neurological:  Negative for weakness and numbness.  ?All other systems reviewed and are negative. ? ?Physical Exam ?Updated Vital Signs ?BP (!) 170/83   Pulse 91   Temp 98.3 ?F (36.8 ?C) (Oral)   Resp 18   Ht 6\' 1"  (1.854 m)   Wt 88.9 kg   SpO2 100%   BMI 25.86 kg/m?  ?Physical Exam ?Vitals and nursing note reviewed.  ?Constitutional:   ?   General: He is not in acute distress. ?   Appearance: Normal appearance. He is well-developed. He is not ill-appearing or diaphoretic.  ?HENT:  ?   Head: Normocephalic and atraumatic.  ?Eyes:  ?   General:     ?  Right eye: No discharge.     ?   Left eye: No discharge.  ?Cardiovascular:  ?   Rate and Rhythm: Normal rate and regular rhythm.  ?   Pulses: Normal pulses.  ?   Heart sounds: Normal heart sounds.  ?Pulmonary:  ?   Effort: Pulmonary effort is normal. No respiratory distress.  ?   Breath sounds: Normal breath sounds. No wheezing or rales.  ?   Comments: Respirations equal and unlabored, patient able to speak in full sentences, lungs clear to auscultation bilaterally  ?Abdominal:  ?   General: Bowel sounds are normal. There is no distension.  ?   Palpations: Abdomen is soft. There is no mass.  ?   Tenderness:  There is no abdominal tenderness. There is no guarding.  ?   Comments: Abdomen soft, nondistended, nontender to palpation in all quadrants without guarding or peritoneal signs  ?Musculoskeletal:  ?   Cervical back: Neck supple.  ?   Comments: Right great toe with prior amputation, there is erythema and swelling extending from the remnant of the great toe to the midfoot, there is a chronic appearing ulceration to the pad of the right great toe but there is also a erythematous and inflamed ulceration to the lateral tip of the toe with clear serous drainage present, no current bleeding or purulence.  Distal pulses 2+, cap refill intact but patient has decreased sensation of pain in the toe.  ?Skin: ?   General: Skin is warm and dry.  ?   Capillary Refill: Capillary refill takes less than 2 seconds.  ?Neurological:  ?   Mental Status: He is alert and oriented to person, place, and time.  ?   Coordination: Coordination normal.  ?   Comments: Speech is clear, able to follow commands ?CN III-XII intact ?Normal strength in upper and lower extremities bilaterally including dorsiflexion and plantar flexion, strong and equal grip strength ?Sensation normal to light and sharp touch ?Moves extremities without ataxia, coordination intact  ?Psychiatric:     ?   Mood and Affect: Mood normal.     ?   Behavior: Behavior normal.  ? ? ?ED Results / Procedures / Treatments   ?Labs ?(all labs ordered are listed, but only abnormal results are displayed) ?Labs Reviewed  ?BASIC METABOLIC PANEL - Abnormal; Notable for the following components:  ?    Result Value  ? Glucose, Bld 157 (*)   ? BUN 46 (*)   ? Creatinine, Ser 2.32 (*)   ? GFR, Estimated 30 (*)   ? All other components within normal limits  ?CBC WITH DIFFERENTIAL/PLATELET - Abnormal; Notable for the following components:  ? HCT 38.2 (*)   ? All other components within normal limits  ?SEDIMENTATION RATE - Abnormal; Notable for the following components:  ? Sed Rate 79 (*)   ? All  other components within normal limits  ?C-REACTIVE PROTEIN - Abnormal; Notable for the following components:  ? CRP 7.9 (*)   ? All other components within normal limits  ?CULTURE, BLOOD (ROUTINE X 2)  ?CULTURE, BLOOD (ROUTINE X 2)  ?AEROBIC CULTURE W GRAM STAIN (SUPERFICIAL SPECIMEN)  ? ? ?EKG ?None ? ?Radiology ?DG Foot Complete Right ? ?Result Date: 04/23/2021 ?CLINICAL DATA:  Partial amputation patent tail with nonhealing sore EXAM: RIGHT FOOT COMPLETE - 3+ VIEW COMPARISON:  None. FINDINGS: Partial amputation with absent distal phalanx of the first digit. There is no erosion or periosteal reaction. Other joint spaces are unremarkable. Vascular calcification.  IMPRESSION: No evidence of osteomyelitis by radiograph. Electronically Signed   By: Macy Mis M.D.   On: 04/23/2021 10:52   ? ?Procedures ?Procedures  ? ? ?Medications Ordered in ED ?Medications  ?vancomycin (VANCOCIN) IVPB 1000 mg/200 mL premix (0 mg Intravenous Stopped 04/23/21 1304)  ?piperacillin-tazobactam (ZOSYN) IVPB 3.375 g (0 g Intravenous Stopped 04/23/21 1303)  ? ? ?ED Course/ Medical Decision Making/ A&P ?  ?                        ?This patient presents to the ED for concern of right great toe infection, this involves an extensive number of treatment options, and is a complaint that carries with it a high risk of complications and morbidity.  The differential diagnosis includes cellulitis, abscess, osteomyelitis, gout. ? ? ?Co morbidities that complicate the patient evaluation ? ?Diabetes ? ? ?Additional history obtained: ? ?Additional history obtained from spouse at bedside ?External records from outside source obtained and reviewed including prior orthopedic follow-up and surgery. ? ? ?Lab Tests: ? ?I Ordered, and personally interpreted labs.  The pertinent results include: No leukocytosis and normal hemoglobin, creatinine of 2.32 with elevated BUN, no prior labs available in system for comparison, outside records show creatinine in 2018 of  1.20 so unclear how acute this change in creatinine is, glucose 157 but otherwise electrolytes are normal.  CRP is elevated at 7.9 and sed rate of 79, likely in the setting of infection.  Blood cultures and woun

## 2021-04-23 NOTE — H&P (Signed)
?History and Physical  ? ? ?Patient: [name] [name] JOA:[id number] DOB: 1956-01-11 ?DOA: 04/23/2021 ?DOS: the patient was seen and examined on 04/23/2021 ?PCP: Ginger Organ., MD - [place]; Candiss Norse is nephrology ?Patient coming from: Home - lives with wife; Donald Prose: Wife, [phone] ? ? ?Chief Complaint: Foot infection ? ?HPI: [name] [name] is a 66 y.o. male with medical history significant of DM and recent toe amputation (01/20/21) presenting with a foot infection. He reports a partial amputation in December and was doing very well.  He got an ulcer on the bottom of his toe and has been to [facility] and Dr. Doran Durand weekly.  He has followed instructions and last night an ulcer on the top of his toe exploded.  They told him to come to the ER.  Glucose was 102 yesterday, 176 the day before.  He was ambulating on it a lot for 2 days last week.  He is unaware of his baseline creatinine (2-2.2 per nephrology) or A1c (patient said the nephrology checks but nephrology reports that PCP checks and patient has refused to see his PCP since 2021 since he is going to other doctors - importance of seeing PCP reinforced). ? ? ? ?ER Course:  Followed by Dr. Doran Durand, had partial toe amputation in Dec.  Had persistent ulcer but it opened last night with pus and blood.  Toe remnant with erythema into midfoot.  Xray negative for osteo.  Hilbert Odor and Dr. Doran Durand with see, needs MRI, starting Vanc/Zosyn.  Creatinine 2.32, unknown baseline. ? ? ? ? ?Review of Systems: As mentioned in the history of present illness. All other systems reviewed and are negative. ?Past Medical History:  ?Diagnosis Date  ? Barrett's esophagus   ? surveillance EGD in May  ? Gout   ? Hypertension   ? MVP (mitral valve prolapse)   ? Raynaud's disease   ? Stage 3b chronic kidney disease (CKD) ([place])   ? Ulcer of foot due to diabetes [facility])   ? Uncontrolled diabetes mellitus with hyperglycemia, with long-term current use of insulin ([place])   ? ?Past  Surgical History:  ?Procedure Laterality Date  ? AMPUTATION TOE Right 01/20/2021  ? Procedure: Right hallux amputation through the proximal phalanx;  Surgeon: Wylene Simmer, MD;  Location: [place];  Service: Orthopedics;  Laterality: Right;  28mn  ? ?Social History:  reports that he has never smoked. He has never used smokeless tobacco. He reports that he does not drink alcohol and does not use drugs. ? ?Allergies  ?Allergen Reactions  ? Erythromycin Hives  ? ? ?History reviewed. No pertinent family history. ? ?Prior to Admission medications   ?Medication Sig Start Date End Date Taking? Authorizing Provider  ?colchicine 0.6 MG tablet Take 0.6 mg by mouth daily.    [provider]  ?dapagliflozin propanediol (FARXIGA) 10 MG TABS tablet Take 10 mg by mouth daily.    [provider]  ?finasteride (PROSCAR) 5 MG tablet Take 5 mg by mouth daily.    [provider]  ?furosemide (LASIX) 40 MG tablet Take 40 mg by mouth.    [provider]  ?insulin degludec (TRESIBA FLEXTOUCH) 200 UNIT/ML FlexTouch Pen Inject 24 Units into the skin daily.    [provider]  ?tadalafil (CIALIS) 5 MG tablet Take 5 mg by mouth daily.    [provider]  ? ? ?Physical Exam: ?Vitals:  ? 04/23/21 1000 04/23/21 1015 04/23/21 1200 04/23/21 1423  ?BP: (Marland Kitchen  151/81 (!) 167/108 (!) 158/89 (!) 145/78  ?Pulse: 90 91 84 82  ?Resp: _0 ?Temp:    99 ?F (37.2 ?C)  ?TempSrc:    Oral  ?SpO2: 100% 100% 100% 99%  ?Weight:      ?Height:      ? ?General:  Appears calm and comfortable and is in NAD ?Eyes:  PERRL, EOMI, normal lids, iris ?ENT:  grossly normal hearing, lips & tongue, mmm; appropriate dentition ?Neck:  no LAD, masses or thyromegaly ?Cardiovascular:  RRR, no m/r/g. No LE edema.  ?Respiratory:   CTA bilaterally with no wheezes/rales/rhonchi.  Normal respiratory effort. ?Abdomen:  soft, NT, ND ?Skin:  callus along the plantar R great toe; pustule along toe stub on the  dorsal side with purulent drainage; there is surrounding edema and erythema extending up to the dorsal ankle.  The second toe and all remaining toes appear unaffected with good capillary refill. ? ? ? ? ? ?Musculoskeletal:  grossly normal tone BUE/BLE, good ROM ?Lower extremity:  As described above.  2+ distal pulses. ?Psychiatric:  grossly normal mood and affect, speech fluent and appropriate, AOx3 ?Neurologic:  CN 2-12 grossly intact, moves all extremities in coordinated fashion ? ? ?Radiological Exams on Admission: ?Independently reviewed - see discussion in A/P where applicable ? ?MR FOOT RIGHT WO CONTRAST ? ?Result Date: 04/23/2021 ?CLINICAL DATA:  I have left of there is identified. Caryl Pina comment can you take out a few moisture deformity still started [employer]. At foot pain and swelling. Diabetic. EXAM: MRI OF THE RIGHT FOREFOOT WITHOUT CONTRAST TECHNIQUE: Multiplanar, multisequence MR imaging of the right foot was performed. No intravenous contrast was administered. COMPARISON:  Radiographs, same date. FINDINGS: Surgical changes from prior distal phalanx resection of the great toe. There is a 2.8 cm fluid collection along the distal aspect of the proximal phalanx of the great toe consistent with an abscess. This appears to communicate with a small open wound. Abnormal T1 and T2 signal intensity in the proximal phalanx of the great toe consistent with osteomyelitis. I do not see any definite findings for septic arthritis at the first MTP joint. No other sites of osteomyelitis are identified. There is diffuse cellulitis and myofasciitis without definite MR findings for pyomyositis. IMPRESSION: 1. Surgical changes from prior distal phalanx resection of the great toe. 2. 2.8 cm fluid collection along the distal aspect of the proximal phalanx of the great toe consistent with an abscess. 3. Abnormal T1 and T2 signal intensity in the proximal phalanx of the great toe consistent with osteomyelitis. 4. Diffuse  cellulitis and myofasciitis without definite MR findings for pyomyositis. Electronically Signed   By: Marijo Sanes M.D.   On: 04/23/2021 16:48  ? ?DG Foot Complete Right ? ?Result Date: 04/23/2021 ?CLINICAL DATA:  Partial amputation patent tail with nonhealing sore EXAM: RIGHT FOOT COMPLETE - 3+ VIEW COMPARISON:  None. FINDINGS: Partial amputation with absent distal phalanx of the first digit. There is no erosion or periosteal reaction. Other joint spaces are unremarkable. Vascular calcification. IMPRESSION: No evidence of osteomyelitis by radiograph. Electronically Signed   By: Macy Mis M.D.   On: 04/23/2021 10:52   ? ?EKG: Independently reviewed.  NSR with rate 85; no evidence of acute ischemia ? ? ?Labs on Admission: I have personally reviewed the available labs and imaging studies at the time of the admission. ? ?Pertinent labs:   ? ?Glucose 157 ?BUN 46/Creatinine 2.32/GFR 30 ?CRP 7.9 ?ESR 79 ?Unremarkable CBC ?Lactate 0.7 ?COVID/flu  negative ?A1c pending ? ? ? ?Assessment and Plan: ?* Diabetic foot infection ([place]) ?-Prior amputation in December of just the R distal great toe ?-He developed a plantar ulcer on the base of the remaining R great toe and has bene closely monitored for this ?-Foot ulcer is present dorsally and draining and now the patient has developed surrounding cellulitis ?-Negative lactate, no current concerns for sepsis ?-Will treat with IV antibiotics (Rocephin/Flagyl as per the lower extremity wound algorithm) ?-Orthopedics will consult ?-Based on refractory ulcer infection with osteitis, it seems that the patient may be heading for additional amputation ?-He has great distal pulses and so ABIs do not appear to be indicated ?-Patient is NPO in case he needs a procedure ?-LE wound order set utilized including labs (CRP, ESR, A1c, prealbumin, HIV, and blood cultures) and consults (diabetes coordinator; peripheral vascular navigator; TOC team; wound care; and nutrition) ? ?Uncontrolled  diabetes mellitus with hyperglycemia, with long-term current use of insulin ([place]) ?-Will check A1c; the importance of knowing his A1c and maintaining it below 7 cannot be stressed enough and this was discussed at len

## 2021-04-23 NOTE — ED Triage Notes (Signed)
Pt came in via POV from dr office d/t Infection in his Rt Foot, Rt Great toe is red/swollen. Pt walking with cane, A/Ox4 upon arrival. Pt reported last night is when he began to have pain in that foot.  ?

## 2021-04-24 ENCOUNTER — Encounter (HOSPITAL_COMMUNITY): Payer: Self-pay | Admitting: Orthopedic Surgery

## 2021-04-24 DIAGNOSIS — E11628 Type 2 diabetes mellitus with other skin complications: Secondary | ICD-10-CM

## 2021-04-24 DIAGNOSIS — L089 Local infection of the skin and subcutaneous tissue, unspecified: Secondary | ICD-10-CM | POA: Diagnosis not present

## 2021-04-24 LAB — BASIC METABOLIC PANEL
Anion gap: 9 (ref 5–15)
BUN: 38 mg/dL — ABNORMAL HIGH (ref 8–23)
CO2: 22 mmol/L (ref 22–32)
Calcium: 8.4 mg/dL — ABNORMAL LOW (ref 8.9–10.3)
Chloride: 106 mmol/L (ref 98–111)
Creatinine, Ser: 2.03 mg/dL — ABNORMAL HIGH (ref 0.61–1.24)
GFR, Estimated: 35 mL/min — ABNORMAL LOW (ref >60)
Glucose, Bld: 103 mg/dL — ABNORMAL HIGH (ref 70–99)
Potassium: 3.8 mmol/L (ref 3.5–5.1)
Sodium: 137 mmol/L (ref 135–145)

## 2021-04-24 LAB — CBC
HCT: 33.2 % — ABNORMAL LOW (ref 39.0–52.0)
Hemoglobin: 11.3 g/dL — ABNORMAL LOW (ref 13.0–17.0)
MCH: 29.3 pg (ref 26.0–34.0)
MCHC: 34 g/dL (ref 30.0–36.0)
MCV: 86 fL (ref 80.0–100.0)
Platelets: 166 10*3/uL (ref 150–400)
RBC: 3.86 MIL/uL — ABNORMAL LOW (ref 4.22–5.81)
RDW: 12.4 % (ref 11.5–15.5)
WBC: 7.6 10*3/uL (ref 4.0–10.5)
nRBC: 0 % (ref 0.0–0.2)

## 2021-04-24 LAB — HEMOGLOBIN A1C
Hgb A1c MFr Bld: 6.8 % — ABNORMAL HIGH (ref 4.8–5.6)
Mean Plasma Glucose: 148 mg/dL

## 2021-04-24 LAB — GLUCOSE, CAPILLARY: Glucose-Capillary: 131 mg/dL — ABNORMAL HIGH (ref 70–99)

## 2021-04-24 MED ORDER — AMOXICILLIN-POT CLAVULANATE 500-125 MG PO TABS
1.0000 | ORAL_TABLET | Freq: Two times a day (BID) | ORAL | Status: DC
  Filled 2021-04-24: qty 1

## 2021-04-24 MED ORDER — DOXYCYCLINE HYCLATE 100 MG PO TABS: 100.0000 mg | ORAL_TABLET | Freq: Two times a day (BID) | ORAL | 0 refills | Status: AC

## 2021-04-24 MED ORDER — AMOXICILLIN-POT CLAVULANATE 500-125 MG PO TABS
1.0000 | ORAL_TABLET | Freq: Two times a day (BID) | ORAL | 0 refills | Status: AC
Start: 2021-04-24 — End: 2021-05-04

## 2021-04-24 MED ORDER — OXYCODONE HCL 5 MG PO TABS
5.0000 mg | ORAL_TABLET | Freq: Four times a day (QID) | ORAL | 0 refills | Status: AC | PRN
Start: 2021-04-24 — End: 2021-04-27

## 2021-04-24 MED ORDER — AMOXICILLIN-POT CLAVULANATE 875-125 MG PO TABS
1.0000 | ORAL_TABLET | Freq: Two times a day (BID) | ORAL | Status: DC
Start: 2021-04-24 — End: 2021-04-24
  Administered 2021-04-24: 1 via ORAL
  Filled 2021-04-24: qty 1

## 2021-04-24 MED ORDER — HEPARIN SODIUM (PORCINE) 5000 UNIT/ML IJ SOLN: 5000.0000 [IU] | Freq: Three times a day (TID) | INTRAMUSCULAR | Status: DC

## 2021-04-24 MED ORDER — FUROSEMIDE 40 MG PO TABS
40.0000 mg | ORAL_TABLET | Freq: Every day | ORAL | Status: DC
Start: 2021-04-24 — End: 2021-04-24
  Administered 2021-04-24: 40 mg via ORAL
  Filled 2021-04-24: qty 1

## 2021-04-24 MED ORDER — DOXYCYCLINE HYCLATE 100 MG PO TABS
100.0000 mg | ORAL_TABLET | Freq: Two times a day (BID) | ORAL | Status: DC
  Administered 2021-04-24: 100 mg via ORAL
  Filled 2021-04-24: qty 1

## 2021-04-24 NOTE — Plan of Care (Signed)
  Problem: Activity: Goal: Risk for activity intolerance will decrease Outcome: Progressing   Problem: Pain Managment: Goal: General experience of comfort will improve Outcome: Progressing   Problem: Safety: Goal: Ability to remain free from injury will improve Outcome: Progressing   

## 2021-04-24 NOTE — Discharge Summary (Signed)
?DISCHARGE SUMMARY ? ?[name] [name] ? ?MR#: [id number] ? ?DOB:[birth year]  ?Date of Admission: 04/23/2021 ?Date of Discharge: 04/24/2021 ? ?Attending Physician:Dontrelle Mazon Silvestre Gunner, MD ? ?Patient's UXN:ATFT, Netta Corrigan., MD ? ?Consults: Orthopedic surgery  ? ?Disposition: D/C home  ? ?Follow-up Appts: ? Follow-up Information   ? ? Toni Arthurs, MD. Schedule an appointment as soon as possible for a visit in 2 week(s).   ?Specialty: Orthopedic Surgery ?Contact information: ?[address] ?[address] ?[place] [address] ?[phone] ? ? ?  ?  ? ? Cleatis Polka., MD Follow up.   ?Specialty: Internal Medicine ?Contact information: ?[address] [facility] ?[place] [address] ?[phone] ? ? ?  ?  ? ? Raymondo Band, MD Follow up on 04/29/2021.   ?Specialty: Infectious Diseases ?Why: As described in your discharge paperwork. ?Contact information: ?[address] ?[address] ?[place] [address] ?[phone] ? ? ?  ?  ? ?  ?  ? ?  ? ? ?Tests Needing Follow-up: ?-f/u culture data to narrow abx spectrum if possible  ?-assess wound to assure healing is progressing as expected  ? ?Discharge Diagnoses: ?Persisting diabetic foot infection ?Uncontrolled DM2 with hyperglycemia ?CKD stage IIIb ?HTN ?Barrett's esophagus ? 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  He stated he was initially doing well after his amputation but then developed an ulcer on the bottom of his toe for which he has been seen in the wound care clinic weekly.  Despite this he experienced the acute onset of rupture of the ulcer on the night of his admission and therefore presented to the ED. ? ?Hospital Course: ? 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 Victorino Dike as oupt in 2 weeks - oxycodone at d/c per Ortho - abx choice as per ID -  wound care counseled per Ortho - clear for d/c home 3/17 ?  ?Uncontrolled DM2 with hyperglycemia ?CBG currently well controlled as inpatient -A1c 6.8 - resume usual home insulin regimen after d/c  ?  ?CKD stage IIIb ?Baseline creatinine approximately 2.2 -creatinine stable during this admissio  ? ?HTN ?Resumed usual home medications, to include lasix  ?  ?Barrett's esophagus ?Asymptomatic during this admit ? ?Allergies as of 04/24/2021   ? ?   Reactions  ? Erythromycin Hives  ? ?  ? ?  ?Medication List  ?  ? ?TAKE these medications   ? ?acetaminophen 500 MG tablet ?Commonly known as: TYLENOL ?Take 500 mg by mouth every 6 (six) hours as needed for mild pain. ?  ?amoxicillin-clavulanate 500-125 MG tablet ?Commonly known as: AUGMENTIN ?Take 1 tablet (500 mg total) by mouth 2 (two) times daily for 10 days. ?  ?dapagliflozin propanediol 10 MG Tabs tablet ?Commonly known as: FARXIGA ?Take 10 mg by mouth daily. ?  ?doxycycline 100 MG tablet ?Commonly known as: VIBRA-TABS ?Take 1 tablet (100 mg total) by mouth every 12 (twelve) hours for 10 days. ?  ?esomeprazole 40 MG capsule ?Commonly known as: NEXIUM ?Take 40 mg by mouth daily. ?  ?finasteride 5 MG tablet ?Commonly known as: PROSCAR ?Take 5 mg by mouth daily. ?  ?furosemide 40 MG tablet ?Commonly known as: LASIX ?Take 40 mg by mouth daily. ?  ?Multi For Him 50+ Tabs ?Take 1 tablet by mouth daily. ?  ?oxyCODONE 5  MG immediate release tablet ?Commonly known as: Roxicodone ?Take 1 tablet (5 mg total) by mouth every 6 (six) hours as needed for up to 3 days for severe pain. ?  ?tadalafil 5 MG tablet ?Commonly known as: CIALIS ?Take 5-20 mg by mouth daily as needed for erectile dysfunction. ?  ?Evaristo Bury FlexTouch 200 UNIT/ML FlexTouch Pen ?Generic drug: insulin degludec ?Inject 24 Units into the skin daily. ?  ? ?  ? ? ?Day of Discharge ?BP (!) 161/83 (BP Location: Left Arm)   Pulse 80   Temp 98.5 ?F (36.9 ?C) (Oral)   Resp 17   Ht 6\' 1"  (1.854 m)   Wt 88.9 kg   SpO2 98%    BMI 25.86 kg/m?  ? ?Physical Exam: ?General: No acute respiratory distress ?Lungs: Clear to auscultation bilaterally without wheezes or crackles ?Cardiovascular: Regular rate and rhythm without murmur gallop or rub normal S1 and S2 ?Abdomen: Nontender, nondistended, soft, bowel sounds positive, no rebound, no ascites, no appreciable mass ?Extremities: trace edema bilateral lower extremities - wound dressed and dry  ? ?Basic Metabolic Panel: ?Recent Labs  ?Lab 04/23/21 ?0929 04/23/21 ?2153 04/24/21 ?0225  ?NA 136  --  137  ?K 4.2  --  3.8  ?CL 105  --  106  ?CO2 22  --  22  ?GLUCOSE 157*  --  103*  ?BUN 46*  --  38*  ?CREATININE 2.32* 2.05* 2.03*  ?CALCIUM 9.0  --  8.4*  ? ?CBC: ?Recent Labs  ?Lab 04/23/21 ?0929 04/24/21 ?0225  ?WBC 9.2 7.6  ?NEUTROABS 6.4  --   ?HGB 13.1 11.3*  ?HCT 38.2* 33.2*  ?MCV 86.2 86.0  ?PLT 195 166  ? ? ?CBG: ?Recent Labs  ?Lab 04/23/21 ?1453 04/23/21 ?1722 04/23/21 ?1911 04/23/21 ?2038 04/24/21 ?1145  ?GLUCAP 141* 120* 119* 109* 131*  ? ? ?Recent Results (from the past 240 hour(s))  ?Aerobic Culture w Gram Stain (superficial specimen)     Status: None (Preliminary result)  ? Collection Time: 04/23/21  9:54 AM  ? Specimen: Wound  ?Result Value Ref Range Status  ? Specimen Description WOUND  Final  ? Special Requests NONE  Final  ? Gram Stain NO WBC SEEN ?NO ORGANISMS SEEN ?  Final  ? Culture   Final  ?  FEW STREPTOCOCCUS AGALACTIAE ?TESTING AGAINST S. AGALACTIAE NOT ROUTINELY PERFORMED DUE TO PREDICTABILITY OF AMP/PEN/VAN SUSCEPTIBILITY. ?CULTURE REINCUBATED FOR BETTER GROWTH ?Performed at [facility], [address] [place], [place] ?  ? Report Status PENDING  Incomplete  ?Culture, blood (routine x 2)     Status: None (Preliminary result)  ? Collection Time: 04/23/21 10:18 AM  ? Specimen: BLOOD LEFT ARM  ?Result Value Ref Range Status  ? Specimen Description BLOOD LEFT ARM  Final  ? Special Requests   Final  ?  BOTTLES DRAWN AEROBIC AND ANAEROBIC Blood Culture results  may not be optimal due to an inadequate volume of blood received in culture bottles  ? Culture   Final  ?  NO GROWTH < 24 HOURS ?Performed at [facility] Lab, [address], [place], [place] ?  ? Report Status PENDING  Incomplete  ?Culture, blood (routine x 2)     Status: None (Preliminary result)  ? Collection Time: 04/23/21 10:19 AM  ? Specimen: BLOOD RIGHT ARM  ?Result Value Ref Range Status  ? Specimen Description BLOOD RIGHT ARM  Final  ? Special Requests   Final  ?  BOTTLES DRAWN  AEROBIC AND ANAEROBIC Blood Culture adequate volume  ? Culture   Final  ?  NO GROWTH < 24 HOURS ?Performed at [facility], [address] [place], [place] [address] ?  ? Report Status PENDING  Incomplete  ?Resp Panel by RT-PCR (Flu A&B, Covid) Nasopharyngeal Swab     Status: None  ? Collection Time: 04/23/21  1:59 PM  ? Specimen: Nasopharyngeal Swab; Nasopharyngeal(NP) swabs in vial transport medium  ?Result Value Ref Range Status  ? SARS Coronavirus 2 by RT PCR NEGATIVE NEGATIVE Final  ?  Comment: (NOTE) ?SARS-CoV-2 target nucleic acids are NOT DETECTED. ? ?The SARS-CoV-2 RNA is generally detectable in upper respiratory ?specimens during the acute phase of infection. The lowest ?concentration of SARS-CoV-2 viral copies this assay can detect is ?138 copies/mL. A negative result does not preclude SARS-Cov-2 ?infection and should not be used as the sole basis for treatment or ?other patient management decisions. A negative result may occur with  ?improper specimen collection/handling, submission of specimen other ?than nasopharyngeal swab, presence of viral mutation(s) within the ?areas targeted by this assay, and inadequate number of viral ?copies(<138 copies/mL). A negative result must be combined with ?clinical observations, patient history, and epidemiological ?information. The expected result is Negative. ? ?Fact Sheet for Patients:  ?BloggerCourse.comhttps://www.fda.gov/media/152166/download ? ?Fact Sheet for Healthcare  Providers:  ?SeriousBroker.ithttps://www.fda.gov/media/152162/download ? ?This test is no t yet approved or cleared by the [place] FDA and  ?has been authorized for detection and/or diagnosis of SARS-CoV-2 by ?FDA under an E

## 2021-04-24 NOTE — Progress Notes (Signed)
Patient asked again about his lasix dose. Mentioned that the swelling in his feet has made the pain unbearable. This RN offered pain medicine again and patient refused. Stated that his outside physician gave him the lasix order specifically for the swelling and pain and that's what works. Patient stated that "if he does not get the lasix, he will leave to get the medicine at home" and that "I know what works and what I need." This RN notified [facility] hospitialist on call again about the lasix order. NP Garner Nash sent a message to the day rounding doctor. Offered to elevate patient's legs with pillows and tylenol and patient refused interventions. Updated patient of message being sent to day time doctor by night coverage, patient appreciative.  ?

## 2021-04-24 NOTE — Consult Note (Signed)
?   ? ? ? ? ?[facility]   ? ?Date of Admission:  04/23/2021    ? ?Reason for Consult: diabetic foot infection    ?Referring Provider: Sharon [name] ? ? ? ? ?Abx: ?3/16-c ceftriaxone/flagyl      ? ? ?Assessment: ?66 yo male with well controlled diabetes mellitus, admitted for right foot proximal hallux phalangeal osteomyelitis/abscess ? ?He is s/p disarticulation at the mtp joint. Mri currently demonstrate no evidence of proximal osseous infection ? ?Culture is pending. He didn't take any antibiotics for at least a month prior to this admission. Bcx ngtd ? ?It is reasonable from ID standpoint to send him out with doxy/augmentin for "mop-up" therapy and cellulitis treatment, and we can see him in the clinic early next week for culture f/u and abx adjustment as needed ? ? ?Discussed with him he might benefit as well in ABI evaluation if not recently done and also potentially be evaluated for diabetic shoes if there is evidence neuropathy in his feet  ? ?Plan: ?Consider outpatient ABI and diabetic shoes fitting ?Ok to change antibiotics to doxycycline 100 mg po bid and amoxicillin-clav 875 mg po bid. Duration 10 days from 3/17 ?Ok from id standpoint to discharge ?Follow up with me at [facility] clinic on 3/22 @ 345pm ?Discussed with primary team ? ? ? ? ? ? ?------------------------------------------------ ?Principal Problem: ?  Diabetic foot infection (HCC) ?Active Problems: ?  Hypertension ?  Stage 3b chronic kidney disease (CKD) (HCC) ?  Barrett's esophagus ?  Uncontrolled diabetes mellitus with hyperglycemia, with long-term current use of insulin (HCC) ? ? ? ?HPI: [name] [name] is a 66 y.o. male dm, non-smoker admitted 3/16 for right hallux diabetic foot infection ? ?He had distal phalangeal infection of the same toe several months ago and had disarticulation at the pip joint. He took 2 weeks of doxycycline empirically postop. He subsequently developed a blister that turned into ulcer at the proximal  phalanx stump on the sole side. On the day of admission this "broke open" and had significant purulence. No fever, chill. No abx for at least a month prior to admission ? ?He had mri here that showed abscess and proximal phalanx om ? ?He underwent mtp disarticulation and I&D 3/16. Currently on ceftriaxone/flagyl ? ?No sign of sepsis otherwise ?Bcx and wound cx ngtd ?He feels well ? ?He last a1c this month is in the 6's. He doesn't smoke ? ? ? ?Fam hx: ?No immunosuppression condition ? ?Social History  ? ?Tobacco Use  ? Smoking status: Never  ? Smokeless tobacco: Never  ?Vaping Use  ? Vaping Use: Never used  ?Substance Use Topics  ? Alcohol use: Never  ? Drug use: Never  ? ? ?Allergies  ?Allergen Reactions  ? Erythromycin Hives  ? ? ?Review of Systems: ?ROS ?All Other ROS was negative, except mentioned above ? ? ?Past Medical History:  ?Diagnosis Date  ? Barrett's esophagus   ? surveillance EGD in May  ? Gout   ? Hypertension   ? MVP (mitral valve prolapse)   ? Raynaud's disease   ? Stage 3b chronic kidney disease (CKD) (HCC)   ? Ulcer of foot due to diabetes [facility])   ? Uncontrolled diabetes mellitus with hyperglycemia, with long-term current use of insulin (HCC)   ? ? ? ? ? ?Scheduled Meds: ? acidophilus  1 capsule Oral Daily  ? amoxicillin-clavulanate  1 tablet Oral Q12H  ? vitamin C  1,000 mg Oral Daily  ?  docusate sodium  100 mg Oral BID  ? doxycycline  100 mg Oral Q12H  ? finasteride  5 mg Oral Daily  ? furosemide  40 mg Oral Daily  ? heparin injection (subcutaneous)  5,000 Units Subcutaneous Q8H  ? insulin aspart  0-15 Units Subcutaneous TID WC  ? insulin aspart  0-5 Units Subcutaneous QHS  ? insulin glargine-yfgn  24 Units Subcutaneous Daily  ? nutrition supplement (JUVEN)  1 packet Oral BID BM  ? pantoprazole  40 mg Oral Daily  ? ?Continuous Infusions: ? magnesium sulfate bolus IVPB    ? ?PRN Meds:.acetaminophen **OR** [DISCONTINUED] acetaminophen, alum & mag hydroxide-simeth, bisacodyl,  guaiFENesin-dextromethorphan, hydrALAZINE, HYDROcodone-acetaminophen, HYDROcodone-acetaminophen, labetalol, magnesium sulfate bolus IVPB, morphine injection, ondansetron **OR** ondansetron (ZOFRAN) IV, phenol, polyethylene glycol, potassium chloride, sorbitol ? ? ?OBJECTIVE: ?Blood pressure (!) 161/83, pulse 80, temperature 98.5 ?F (36.9 ?C), temperature source Oral, resp. rate 17, height 6\' 1"  (1.854 m), weight 88.9 kg, SpO2 98 %. ? ?Physical Exam ?General/constitutional: no distress, pleasant ?HEENT: Normocephalic, PER, Conj Clear, EOMI, Oropharynx clear ?Neck supple ?CV: rrr no mrg ?Lungs: clear to auscultation, normal respiratory effort ?Abd: Soft, Nontender ?Ext: no edema ?Skin: No Rash ?Neuro: nonfocal ?MSK: right foot in dressing that is clean/dry. No surrounding cellulitis change ? ? ? ? ? ?Lab Results ?Lab Results  ?Component Value Date  ? WBC 7.6 04/24/2021  ? HGB 11.3 (L) 04/24/2021  ? HCT 33.2 (L) 04/24/2021  ? MCV 86.0 04/24/2021  ? PLT 166 04/24/2021  ?  ?Lab Results  ?Component Value Date  ? CREATININE 2.03 (H) 04/24/2021  ? BUN 38 (H) 04/24/2021  ? NA 137 04/24/2021  ? K 3.8 04/24/2021  ? CL 106 04/24/2021  ? CO2 22 04/24/2021  ? No results found for: ALT, AST, GGT, ALKPHOS, BILITOT  ? ? ?Microbiology: ?Recent Results (from the past 240 hour(s))  ?Aerobic Culture w Gram Stain (superficial specimen)     Status: None (Preliminary result)  ? Collection Time: 04/23/21  9:54 AM  ? Specimen: Wound  ?Result Value Ref Range Status  ? Specimen Description WOUND  Final  ? Special Requests NONE  Final  ? Gram Stain NO WBC SEEN ?NO ORGANISMS SEEN ?  Final  ? Culture   Final  ?  FEW STREPTOCOCCUS AGALACTIAE ?TESTING AGAINST S. AGALACTIAE NOT ROUTINELY PERFORMED DUE TO PREDICTABILITY OF AMP/PEN/VAN SUSCEPTIBILITY. ?CULTURE REINCUBATED FOR BETTER GROWTH ?Performed at [facility], [address], [place], [place] ?  ? Report Status PENDING  Incomplete  ?Culture, blood (routine x 2)     Status: None  (Preliminary result)  ? Collection Time: 04/23/21 10:18 AM  ? Specimen: BLOOD LEFT ARM  ?Result Value Ref Range Status  ? Specimen Description BLOOD LEFT ARM  Final  ? Special Requests   Final  ?  BOTTLES DRAWN AEROBIC AND ANAEROBIC Blood Culture results may not be optimal due to an inadequate volume of blood received in culture bottles  ? Culture   Final  ?  NO GROWTH < 24 HOURS ?Performed at [facility] Lab, [address] [place], [place] ?  ? Report Status PENDING  Incomplete  ?Culture, blood (routine x 2)     Status: None (Preliminary result)  ? Collection Time: 04/23/21 10:19 AM  ? Specimen: BLOOD RIGHT ARM  ?Result Value Ref Range Status  ? Specimen Description BLOOD RIGHT ARM  Final  ? Special Requests   Final  ?  BOTTLES DRAWN AEROBIC AND ANAEROBIC Blood  Culture adequate volume  ? Culture   Final  ?  NO GROWTH < 24 HOURS ?Performed at [facility], [address] [place], [place] [address] ?  ? Report Status PENDING  Incomplete  ?Resp Panel by RT-PCR (Flu A&B, Covid) Nasopharyngeal Swab     Status: None  ? Collection Time: 04/23/21  1:59 PM  ? Specimen: Nasopharyngeal Swab; Nasopharyngeal(NP) swabs in vial transport medium  ?Result Value Ref Range Status  ? SARS Coronavirus 2 by RT PCR NEGATIVE NEGATIVE Final  ?  Comment: (NOTE) ?SARS-CoV-2 target nucleic acids are NOT DETECTED. ? ?The SARS-CoV-2 RNA is generally detectable in upper respiratory ?specimens during the acute phase of infection. The lowest ?concentration of SARS-CoV-2 viral copies this assay can detect is ?138 copies/mL. A negative result does not preclude SARS-Cov-2 ?infection and should not be used as the sole basis for treatment or ?other patient management decisions. A negative result may occur with  ?improper specimen collection/handling, submission of specimen other ?than nasopharyngeal swab, presence of viral mutation(s) within the ?areas targeted by this assay, and inadequate number of viral ?copies(<138 copies/mL).  A negative result must be combined with ?clinical observations, patient history, and epidemiological ?information. The expected result is Negative. ? ?Fact Sheet for Patients:  ?BloggerCourse.comhttps://www.fda.gov/media/152166/download

## 2021-04-24 NOTE — Consult Note (Addendum)
WOC consult requested for foot wound prior to ortho service involvement. Dr Victorino Dike performed surgery yesterday; please refer to his team for questions regarding plan of care. ?Please re-consult if further assistance is needed.  Thank-you,  ?Cammie Mcgee MSN, RN, CWOCN, Marty, CNS ?[phone]  ?

## 2021-04-24 NOTE — Progress Notes (Signed)
Discharge package printed and instructions given to patient. Verbalizes understanding.  

## 2021-04-24 NOTE — Progress Notes (Signed)
Subjective: ?1 Day Post-Op Procedure(s) (LRB): ?AMPUTATION RIGHT GREAT TOE (Right) ? ?Patient reports pain as mild to moderate.  Patient reports pain is well controlled at surgical site.  He is more concerned about the swelling and pain in his feet since discontinuing lasix yesterday.  Hospitalists has been consulted to help with management.  Otherwise patient denies fever, chills, N/V, CP, SOB.  Admits to flatus.  Notes that he doesn't have much of an appetite but could eat breakfast.  Denies stomach pain. ? ?Objective:  ? ?VITALS:  Temp:  [97.6 ?F (36.4 ?C)-99 ?F (37.2 ?C)] 98.5 ?F (36.9 ?C) (03/17 0730) ?Pulse Rate:  [73-99] 80 (03/17 0730) ?Resp:  [13-22] 17 (03/17 0730) ?BP: (123-170)/(66-108) 161/83 (03/17 0730) ?SpO2:  [94 %-100 %] 98 % (03/17 0730) ?Weight:  [88.9 kg] 88.9 kg (03/16 0922) ? ?General: WDWN patient in NAD. ?Psych:  Appropriate mood and affect. ?Neuro:  A&O x 3, Moving all extremities, sensation intact to light touch ?HEENT:  EOMs intact ?Chest:  Even non-labored respirations ?Skin:  Incision C/D/I, no rashes or lesions ?Extremities: warm/dry, mild edema to exposed lesser toes, no erythema or echymosis.  No lymphadenopathy. ?Pulses: Popliteus 2+ ?MSK:  ROM: Can actively DF/PF exposed lesser toes, MMT: able to perform quad set, (-) Homan's  ? ? ?LABS ?Recent Labs  ?  04/23/21 ?0929 04/24/21 ?0225  ?HGB 13.1 11.3*  ?WBC 9.2 7.6  ?PLT 195 166  ? ?Recent Labs  ?  04/23/21 ?0929 04/23/21 ?2153 04/24/21 ?0225  ?NA 136  --  137  ?K 4.2  --  3.8  ?CL 105  --  106  ?CO2 22  --  22  ?BUN 46*  --  38*  ?CREATININE 2.32* 2.05* 2.03*  ?GLUCOSE 157*  --  103*  ? ?No results for input(s): LABPT, INR in the last 72 hours. ? ? ?Assessment/Plan: ?1 Day Post-Op Procedure(s) (LRB): ?AMPUTATION RIGHT GREAT TOE (Right) ? ?WBAT R LE in flat post-op [name]. ?Reinforce dressing prn ?Cultures pending.  No growth so far.  ID consulted to determine D/C ABX. ?Patient has elevated SCr and demanding his lasix.  Will defer to  Medicine team. ?Plan for 2 week outpatient post-op visit with Dr. Victorino Dike. ?D/C script for oxycodone sent to patient's pharmacy after searching [place] PMP Aware. ? ?[name] Martinez PA-C ?[facility] ?Office:  [phone]  ? ? ?[name] [name] ?04/24/2021, 7:35 AM  ?

## 2021-04-24 NOTE — Discharge Instructions (Signed)
[name] Simmer, MD ?[facility] ? ?Please read the following information regarding your care after surgery. ? ?Medications  ?You only need a prescription for the narcotic pain medicine (ex. oxycodone, Percocet, Norco).  All of the other medicines listed below are available over the counter. ?? acetominophen (Tylenol) 650 mg every 4-6 hours as you need for minor to moderate pain ?? oxycodone as prescribed for severe pain ? ?Narcotic pain medicine (ex. oxycodone, Percocet, Vicodin) will cause constipation.  To prevent this problem, take the following medicines while you are taking any pain medicine. ?? docusate sodium (Colace) 100 mg twice a day ? senna (Senokot) 2 tablets twice a day ? ?Weight Bearing ?? Bear weight only on your operated foot in the post-op shoe. ? ? ?Cast / Splint / Dressing ?? Keep your splint, cast or dressing clean and dry.  Don?t put anything (coat hanger, pencil, etc) down inside of it.  If it gets damp, use a hair dryer on the cool setting to dry it.  If it gets soaked, call the office to schedule an appointment for a cast change. ? ? ?After your dressing, cast or splint is removed; you may shower, but do not soak or scrub the wound.  Allow the water to run over it, and then gently pat it dry. ? ?Swelling ?It is normal for you to have swelling where you had surgery.  To reduce swelling and pain, keep your toes above your nose for at least 3 days after surgery.  It may be necessary to keep your foot or leg elevated for several weeks.  If it hurts, it should be elevated. ? ?Follow Up ?Call my office at [phone] when you are discharged from the hospital or surgery center to schedule an appointment to be seen two weeks after surgery. ? ?Call my office at [phone] if you develop a fever >101.5? F, nausea, vomiting, bleeding from the surgical site or severe pain.   ?  ?

## 2021-04-24 NOTE — Progress Notes (Signed)
Patient asking about his home dose of lasix he normally takes PRN. NP on call with [facility] paged about patient's concerns. NP Garner Nash mentioned that the lasix was held because of patient's kidney lab functions. This RN relayed message to patient. This RN mentioned to the patient about passing this along to day shift to see when lasix could be reordered because with out it, the swelling in his feet is very painful. Patient was okay with that plan. Pain medication offered to patient for feet pain but patient refused. Will continue to monitor. ?

## 2021-04-25 LAB — AEROBIC CULTURE W GRAM STAIN (SUPERFICIAL SPECIMEN): Gram Stain: NONE SEEN

## 2021-04-28 LAB — CULTURE, BLOOD (ROUTINE X 2)
Culture: NO GROWTH
Culture: NO GROWTH
Special Requests: ADEQUATE

## 2021-04-28 LAB — AEROBIC/ANAEROBIC CULTURE W GRAM STAIN (SURGICAL/DEEP WOUND)

## 2021-04-29 ENCOUNTER — Ambulatory Visit (INDEPENDENT_AMBULATORY_CARE_PROVIDER_SITE_OTHER): Payer: Managed Care, Other (non HMO) | Admitting: Internal Medicine

## 2021-04-29 ENCOUNTER — Other Ambulatory Visit: Payer: Self-pay

## 2021-04-29 VITALS — BP 169/94 | HR 88 | Resp 16 | Ht 73.0 in | Wt 196.0 lb

## 2021-04-29 DIAGNOSIS — L089 Local infection of the skin and subcutaneous tissue, unspecified: Secondary | ICD-10-CM

## 2021-04-29 DIAGNOSIS — E11628 Type 2 diabetes mellitus with other skin complications: Secondary | ICD-10-CM | POA: Diagnosis not present

## 2021-04-29 NOTE — Progress Notes (Signed)
?  ? ? ? ? ?[facility] ? ?Patient Active Problem List  ? Diagnosis Date Noted  ? Diabetic foot infection (HCC) 04/23/2021  ? Hypertension 04/23/2021  ? Stage 3b chronic kidney disease (CKD) (HCC) 04/23/2021  ? Barrett's esophagus 04/23/2021  ? Uncontrolled diabetes mellitus with hyperglycemia, with long-term current use of insulin (HCC) 04/23/2021  ? Travel advice encounter 03/02/2013  ? ? ? ? ?Subjective:  ? ? Patient ID: [name] [name], male    DOB: 01/22/1956, 66 y.o.   MRN: [id number] ? ?Chief Complaint  ?Patient presents with  ? Hospitalization Follow-up  ? ? ?HPI: ? ?[name] [name] is a 66 y.o. male here for f/u diabetic foot infection ? ?He had recent right hallux pip disarticulation for distal om several months prior. He did well be developed proximal phalanx and underwent disarticulation at the mtp joint with I&D on 3/16 ? ?He did well on discharge with doxy/amox-clav empiric abx of planned 10 days  ?His cx had grown gbs ?He didn't take abx before surgery for the month prior ? ?No fc ?Dressing not changed yet since discharge at his surgeon's advice. He will be following up with surgery ? ? ?Allergies  ?Allergen Reactions  ? Erythromycin Hives  ? ? ? ? ?Outpatient Medications Prior to Visit  ?Medication Sig Dispense Refill  ? acetaminophen (TYLENOL) 500 MG tablet Take 500 mg by mouth every 6 (six) hours as needed for mild pain.    ? amoxicillin-clavulanate (AUGMENTIN) 500-125 MG tablet Take 1 tablet (500 mg total) by mouth 2 (two) times daily for 10 days. 20 tablet 0  ? dapagliflozin propanediol (FARXIGA) 10 MG TABS tablet Take 10 mg by mouth daily.    ? doxycycline (VIBRA-TABS) 100 MG tablet Take 1 tablet (100 mg total) by mouth every 12 (twelve) hours for 10 days. 20 tablet 0  ? esomeprazole (NEXIUM) 40 MG capsule Take 40 mg by mouth daily.    ? finasteride (PROSCAR) 5 MG tablet Take 5 mg by mouth daily.    ? furosemide (LASIX) 40 MG tablet Take 40 mg by mouth daily.    ? insulin  degludec (TRESIBA FLEXTOUCH) 200 UNIT/ML FlexTouch Pen Inject 24 Units into the skin daily.    ? Multiple Vitamins-Minerals (MULTI FOR HIM 50+) TABS Take 1 tablet by mouth daily.    ? tadalafil (CIALIS) 5 MG tablet Take 5-20 mg by mouth daily as needed for erectile dysfunction.    ? ?No facility-administered medications prior to visit.  ? ? ? ?Social History  ? ?Socioeconomic History  ? Marital status: Married  ?  Spouse name: Not on file  ? Number of children: Not on file  ? Years of education: Not on file  ? Highest education level: Not on file  ?Occupational History  ? Occupation: Firefighterfinancial advisor  ?Tobacco Use  ? Smoking status: Never  ? Smokeless tobacco: Never  ?Vaping Use  ? Vaping Use: Never used  ?Substance and Sexual Activity  ? Alcohol use: Never  ? Drug use: Never  ? Sexual activity: Not on file  ?Other Topics Concern  ? Not on file  ?Social History Narrative  ? Not on file  ? ?Social Determinants of Health  ? ?Financial Resource Strain: Not on file  ?Food Insecurity: Not on file  ?Transportation Needs: Not on file  ?Physical Activity: Not on file  ?Stress: Not on file  ?Social Connections: Not on file  ?Intimate Partner Violence: Not on file  ? ? ? ? ?  Review of Systems ?   ? ?Objective:  ?  ?Resp 16   Ht 6\' 1"  (1.854 m)   Wt 196 lb (88.9 kg)   BMI 25.86 kg/m?  ?Nursing note and vital signs reviewed. ? ?Physical Exam ? ?   ?General/constitutional: no distress, pleasant ?HEENT: Normocephalic, PER, Conj Clear, EOMI, Oropharynx clear ?Neck supple ?CV: rrr no mrg ?Lungs: clear to auscultation, normal respiratory effort ?Abd: Soft, Nontender ?Ext: no edema ?Skin: No Rash ?Neuro: nonfocal ?MSK: dressing with old blood. I left the clear dressing on. The suture is intact. There is no active oozing/bleeding; the wound is without dehiscence; suture intact. No redness/tenderness/swelling. New dressing applied ? ? ?Labs: ?Lab Results  ?Component Value Date  ? WBC 7.6 04/24/2021  ? HGB 11.3 (L) 04/24/2021  ? HCT  33.2 (L) 04/24/2021  ? MCV 86.0 04/24/2021  ? PLT 166 04/24/2021  ? ?Last metabolic panel ?Lab Results  ?Component Value Date  ? GLUCOSE 103 (H) 04/24/2021  ? NA 137 04/24/2021  ? K 3.8 04/24/2021  ? CL 106 04/24/2021  ? CO2 22 04/24/2021  ? BUN 38 (H) 04/24/2021  ? CREATININE 2.03 (H) 04/24/2021  ? GFRNONAA 35 (L) 04/24/2021  ? CALCIUM 8.4 (L) 04/24/2021  ? ANIONGAP 9 04/24/2021  ? ?   ?Component Value Date/Time  ? CRP 7.9 (H) 04/23/2021 0929  ? ? ?Micro: ? ?Serology: ? ?Imaging: ? ?Assessment & Plan:  ? ?Problem List Items Addressed This Visit   ? ?  ? Endocrine  ? Diabetic foot infection (HCC) - Primary  ? ? ? ? ?No orders of the defined types were placed in this encounter. ? ? ? ?Abx: ?3/16-c ceftriaxone/flagyl                                                        ?  ?  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  ?He is s/p disarticulation at the mtp joint. Mri currently demonstrate no evidence of proximal osseous infection ?  ?Culture GBS. He didn't take any antibiotics for at least a month prior to this admission. Bcx ngtd ?  ?Discharged on empiric doxy/augmentin. Given current data, he can stop doxycycline and finish amox-clav for another 5 days  ?  ?Discussed with him he might benefit as well in ABI evaluation if not recently done and also potentially be evaluated for diabetic shoes if there is evidence neuropathy in his feet. He can discuss with his surgeon about this ? ?Clinically today the surgical site looks great with no evidence of dehiscence or cellulitic change or underlying abscess ?  ?Plan: ?Can stop doxycycline; finish amox-clav on 3/27 ?Follow up with orthopedic surgery and consider getting podiatry for routine diabetic foot care ?He asked about going to [place] in June. I don't see a reason why not if the current incision healed and no active infection; I do not see any active  infection at this time. He can bring dox/augmentin 2 weeks supply along and take as needed if redness/fever/chill/swelling-purulence  ? ? ? ?Follow-up: Return if symptoms worsen or fail to improve. ? ? ? ? ? ?July, MD ?[facility] ?[facility] ?[phone]  pager   [phone] cell ?04/29/2021,  3:42 PM ? ?

## 2021-04-29 NOTE — Patient Instructions (Addendum)
It appears your infection is well treated... culture shows group B strep; you can stop doxycycline ? ? ?Finish 5 more days of amoxicillin-clavulonate acid (augmentin) ? ?Please still see orthopedic surgeon ? ?Please see a podiatrist for ongoing preventative care of your foot ? ? ?Once your foot heals, you can go to [place]. Sign of infection is redness, swelling, pus. If you see these take doxy/augmentin and seek care immediately ? ? ?

## 2021-04-30 ENCOUNTER — Encounter (HOSPITAL_BASED_OUTPATIENT_CLINIC_OR_DEPARTMENT_OTHER): Payer: Managed Care, Other (non HMO) | Admitting: General Surgery

## 2022-07-12 ENCOUNTER — Other Ambulatory Visit: Payer: Self-pay | Admitting: Urology

## 2022-07-12 DIAGNOSIS — R972 Elevated prostate specific antigen [PSA]: Secondary | ICD-10-CM

## 2022-07-21 ENCOUNTER — Encounter: Payer: Self-pay | Admitting: Internal Medicine

## 2022-07-21 ENCOUNTER — Other Ambulatory Visit: Payer: Self-pay | Admitting: Internal Medicine

## 2022-07-21 DIAGNOSIS — Z136 Encounter for screening for cardiovascular disorders: Secondary | ICD-10-CM

## 2022-09-01 ENCOUNTER — Ambulatory Visit
Admission: RE | Admit: 2022-09-01 | Discharge: 2022-09-01 | Disposition: A | Discharge status: Home / Self Care | Payer: Managed Care, Other (non HMO) | Source: Ambulatory Visit | Attending: Urology | Admitting: Urology

## 2022-09-01 DIAGNOSIS — R972 Elevated prostate specific antigen [PSA]: Secondary | ICD-10-CM

## 2022-09-01 MED ORDER — GADOPICLENOL 0.5 MMOL/ML IV SOLN
9.0000 mL | Freq: Once | INTRAVENOUS | Status: AC | PRN
  Administered 2022-09-01: 9 mL via INTRAVENOUS

## 2023-09-05 NOTE — Progress Notes (Signed)
 REFERRAL ENTERED

## 2023-09-13 NOTE — Progress Notes (Signed)
 Referral received. Care Everywhere initiated. Records reviewed. Pt is okay for education.  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 Dennise

## 2023-09-15 NOTE — Telephone Encounter (Signed)
 Call to Pt re: transplant referral. Message, with contact information, left.

## 2023-09-21 ENCOUNTER — Encounter (HOSPITAL_BASED_OUTPATIENT_CLINIC_OR_DEPARTMENT_OTHER)
Admission: RE | Admit: 2023-09-21 | Discharge: 2023-09-21 | Disposition: A | Discharge status: Home / Self Care | Source: Ambulatory Visit | Attending: Orthopedic Surgery | Admitting: Orthopedic Surgery

## 2023-09-21 ENCOUNTER — Encounter (HOSPITAL_BASED_OUTPATIENT_CLINIC_OR_DEPARTMENT_OTHER): Payer: Self-pay | Admitting: Orthopedic Surgery

## 2023-09-21 ENCOUNTER — Other Ambulatory Visit: Payer: Self-pay | Admitting: Orthopedic Surgery

## 2023-09-21 DIAGNOSIS — Z01818 Encounter for other preprocedural examination: Secondary | ICD-10-CM | POA: Diagnosis not present

## 2023-09-21 DIAGNOSIS — E1122 Type 2 diabetes mellitus with diabetic chronic kidney disease: Secondary | ICD-10-CM | POA: Insufficient documentation

## 2023-09-21 DIAGNOSIS — Z01810 Encounter for preprocedural cardiovascular examination: Secondary | ICD-10-CM | POA: Diagnosis present

## 2023-09-21 DIAGNOSIS — E1165 Type 2 diabetes mellitus with hyperglycemia: Secondary | ICD-10-CM | POA: Diagnosis not present

## 2023-09-21 DIAGNOSIS — N1832 Chronic kidney disease, stage 3b: Secondary | ICD-10-CM | POA: Insufficient documentation

## 2023-09-21 DIAGNOSIS — Z01812 Encounter for preprocedural laboratory examination: Secondary | ICD-10-CM | POA: Diagnosis present

## 2023-09-21 LAB — BASIC METABOLIC PANEL WITH GFR
Anion gap: 10 (ref 5–15)
BUN: 62 mg/dL — ABNORMAL HIGH (ref 8–23)
CO2: 17 mmol/L — ABNORMAL LOW (ref 22–32)
Calcium: 8.7 mg/dL — ABNORMAL LOW (ref 8.9–10.3)
Chloride: 113 mmol/L — ABNORMAL HIGH (ref 98–111)
Creatinine, Ser: 4.27 mg/dL — ABNORMAL HIGH (ref 0.61–1.24)
GFR, Estimated: 14 mL/min — ABNORMAL LOW (ref >60)
Glucose, Bld: 115 mg/dL — ABNORMAL HIGH (ref 70–99)
Potassium: 4.8 mmol/L (ref 3.5–5.1)
Sodium: 140 mmol/L (ref 135–145)

## 2023-09-21 NOTE — Progress Notes (Signed)
 Patient's chart and recent PCP notes reviewed with Dr Maryclare, OK for [facility].

## 2023-09-21 NOTE — Telephone Encounter (Signed)
 Message left for Pt to schedule transplant education class. Message, with contact information. Advised of deadline of 09/28/23.  Call to provider office to advise of the same. Spoke with Charese who will reach out to Pt.

## 2023-09-21 NOTE — Progress Notes (Signed)
 Patient came in for PAT, BP 234/109. Patient states that he does not tolerate any BP meds and that he does not even try to take them anymore. Dr Lyn made aware and states that he will need to be done at Main OR. Meagan at Dr [facility] office made aware.

## 2023-09-25 NOTE — Anesthesia Preprocedure Evaluation (Signed)
 Anesthesia Evaluation    Airway        Dental   Pulmonary           Cardiovascular hypertension,      Neuro/Psych    GI/Hepatic   Endo/Other  diabetes    Renal/GU      Musculoskeletal   Abdominal   Peds  Hematology   Anesthesia Other Findings   Reproductive/Obstetrics                              Anesthesia Physical Anesthesia Plan  ASA:   Anesthesia Plan:    Post-op Pain Management:    Induction:   PONV Risk Score and Plan:   Airway Management Planned:   Additional Equipment:   Intra-op Plan:   Post-operative Plan:   Informed Consent:   Plan Discussed with:   Anesthesia Plan Comments: (PAT note written 09/25/2023 by Estefana Taylor, PA-C.  )        Anesthesia Quick Evaluation

## 2023-09-25 NOTE — Progress Notes (Signed)
 Anesthesia Chart Review:SAME DAY WORK-UP    Case: [id number] Date/Time: 09/27/23 1345   Procedure: AMPUTATION, TOE (Right: Toe) - 2nd toe   Anesthesia type: Monitor Anesthesia Care   Diagnosis: Diabetic foot ulcer with osteomyelitis (HCC) [E11.621, E11.69, L97.509, M86.9]   Pre-op diagnosis: Ulcer of right foot due to type 2 diabetes mellitus   [facility] ROOM 08 / [facility]   Surgeons: Kit Rush, MD       DISCUSSION: Patient is a 68 year old male scheduled for the above procedure.  History includes never smoker, uncontrolled DM2 (with neuropathy), diabetic foot ulcer (s/p right hallux amputation through IP joint 01/20/21 & through MP joint 04/23/21), untreated HTN, CKD (stage 4), secondary hyperparathyroidism, MVP ('without murmurs 09/07/23 PCP exam), Raynaud's disease, GERD (with Barrett's esophagus), HLD (statin intolerance/myopathy).  BP was measured at 234/104 at [facility] PAT visit. He reported intolerance to multiple BP medications and does not even try to take them anymore. Anesthesiologist Maryclare Cornet, MD advised to move case to [facility] Main OR. Of note, per 09/07/23 PCP notes with Dr. Loreli, patient discontinued losartan due to fatigue and risk of hyperkalemia (in setting of CKD4). Nephrology started him on amlodipine, but he discontinued due to elevated blood sugars. He in using Lasix . Comparison BP from 09/07/23 PCP visit was not nearly as elevated and was recorded as 148/62.   A1c 5.3% on Farxiga, Tresiba. Preoperative instructions pending PAT RN phone call.   He has a diabetic toe ulcer with need for amputation. At least one BP was significantly elevated as mentioned above and case was moved to main OR. He also has known CKD stage IV followed by Dr. Dennise with pending transplant evaluation. Anesthesia team to evaluate on the day of surgery.    VS: BP (!) 234/104 (BP Location: Right Arm)  BP 148/62 on 09/07/23 ([facility]). RUE 234/104, LUE 224/109.   PROVIDERS: Loreli Elsie JONETTA Mickey., MD is  PCP at [facility]) Dennise Hoes, MD is nephrologist Renda Glance, MD is urologist Rosalie Kitchens, MD is GI   LABS: BMP from 09/21/23 and additional labs from PCP on 08/24/23 noted. Known CKD4 followed by nephrology, PCP notes indicate he has been referred to [facility] for transplant consideration. PSA monitored by urology. (all labs ordered are listed, but only abnormal results are displayed)  Labs Reviewed  BASIC METABOLIC PANEL WITH GFR - Abnormal; Notable for the following components:      Result Value   Chloride 113 (*)    CO2 17 (*)    Glucose, Bld 115 (*)    BUN 62 (*)    Creatinine, Ser 4.27 (*)    Calcium 8.7 (*)    GFR, Estimated 14 (*)    All other components within normal limits  Total bilirubin 0.5, Cr 3.8, AST 13, ALT 8, Alk phos 45, BUN 52, WBC 5.20, H/H 10.9/32.1, PLT 141, uric acid 7.2, TSH 2.98, PSA 20.20, intact PTH 88, Ph 3.1, A1c 5.3% on 08/24/50 ([facility] Care Everywhere)   EKG: 09/21/23: NSR   CV: N/A  Past Medical History:  Diagnosis Date   Barrett's esophagus    surveillance EGD in May   GERD (gastroesophageal reflux disease)    Gout    Hypertension    MVP (mitral valve prolapse)    Raynaud's disease    Stage 3b chronic kidney disease (CKD) (HCC)    CKD progressed to stg 4   Ulcer of foot due to diabetes (HCC)    Uncontrolled diabetes  mellitus with hyperglycemia, with long-term current use of insulin  [facility])     Past Surgical History:  Procedure Laterality Date   AMPUTATION TOE Right 01/20/2021   Procedure: Right hallux amputation through the proximal phalanx;  Surgeon: Kit Rush, MD;  Location: [facility];  Service: Orthopedics;  Laterality: Right;    I & D EXTREMITY Right 04/23/2021   Procedure: AMPUTATION RIGHT GREAT TOE;  Surgeon: Kit Rush, MD;  Location: [facility] OR;  Service: Orthopedics;  Laterality: Right;    MEDICATIONS: No current facility-administered medications for this encounter.    colchicine  0.6 MG tablet   dapagliflozin propanediol (FARXIGA) 10 MG TABS tablet   esomeprazole (NEXIUM) 40 MG capsule   furosemide  (LASIX ) 40 MG tablet   insulin  degludec (TRESIBA FLEXTOUCH) 200 UNIT/ML FlexTouch Pen    Isaiah Ruder, PA-C Surgical Short Stay/Anesthesiology [facility] Phone [phone] [facility] Phone [phone] 09/25/2023 9:11 AM

## 2023-09-26 NOTE — Progress Notes (Signed)
 A user error has taken place: encounter opened in error, closed for administrative reasons.

## 2023-09-26 NOTE — Telephone Encounter (Signed)
 Patient called and explained to RN that he has an active foot infection. Patient scheduled to have amputation of 2nd toe/right foot on 09/29/23. RN explained to patient that we will close him out until he is healed from amputation. Verified pt mailing address   Inbasket sent to outreach coordinator to notify referral source   Letter sent to patient & faxed to nephrologist

## 2023-09-27 ENCOUNTER — Encounter (HOSPITAL_COMMUNITY): Payer: Self-pay | Admitting: Anesthesiology

## 2023-09-27 ENCOUNTER — Encounter (HOSPITAL_COMMUNITY): Admission: RE | Payer: Self-pay | Source: Home / Self Care

## 2023-09-27 ENCOUNTER — Ambulatory Visit (HOSPITAL_COMMUNITY): Admission: RE | Admit: 2023-09-27 | Source: Home / Self Care | Admitting: Orthopedic Surgery

## 2023-09-27 DIAGNOSIS — N1832 Chronic kidney disease, stage 3b: Secondary | ICD-10-CM

## 2023-09-27 DIAGNOSIS — Z794 Long term (current) use of insulin: Secondary | ICD-10-CM

## 2023-09-27 DIAGNOSIS — E1165 Type 2 diabetes mellitus with hyperglycemia: Secondary | ICD-10-CM

## 2023-09-27 HISTORY — DX: Gastro-esophageal reflux disease without esophagitis: K21.9

## 2023-09-27 SURGERY — AMPUTATION, TOE
Anesthesia: Monitor Anesthesia Care | Site: Toe | Laterality: Right

## 2023-09-28 NOTE — Progress Notes (Incomplete)
 PCP - Loreli Elsie JONETTA Mickey., MD  Cardiologist - ***  PPM/ICD - *** Device Orders - *** Rep Notified - ***  Chest x-ray - *** EKG - 09-21-23 Stress Test - *** ECHO - *** Cardiac Cath - ***  CPAP - ***  GLP-1 -  Fasting Blood Sugar - *** Checks Blood Sugar ***/day  Blood Thinner Instructions: *** Aspirin Instructions: ***  ERAS Protcol - Clear liquids until 12:00 PM. (NOON)  COVID TEST- n/a  Anesthesia review: yes, HX of HTN, DM  Patient verbally denies any shortness of breath, fever, cough and chest pain during phone call   -------------  SDW INSTRUCTIONS given:  Your procedure is scheduled on September 29, 2023.  Report to [facility] Main Entrance A at 12:30 P.M., and check in at the Admitting office.  Call this number if you have problems the morning of surgery:  [phone]   Remember:  Do not eat after midnight the night before your surgery  You may drink clear liquids until 12:00 PM  ( noon) the day  of your surgery.   Clear liquids allowed are: Water, Non-Citrus Juices (without pulp), Carbonated Beverages, Clear Tea, Black Coffee Only, and Gatorade    Take these medicines the morning of surgery with A SIP OF WATER  colchicine  esomeprazole (NEXIUM)   As of today, STOP taking any Aspirin (unless otherwise instructed by your surgeon) Aleve, Naproxen, Ibuprofen, Motrin, Advil, Goody's, BC's, all herbal medications, fish oil, and all vitamins. WHAT DO I DO ABOUT MY DIABETES MEDICATION?   Do not take oral diabetes medicines dapagliflozin propanediol (FARXIGA)  the morning of surgery.  THE NIGHT BEFORE SURGERY, take ___________ units of ___________insulin.       THE MORNING OF SURGERY, take _____________ units of __________insulin.  The day of surgery, do not take other diabetes injectables, including Byetta (exenatide), Bydureon (exenatide ER), Victoza (liraglutide), or Trulicity (dulaglutide).  If your CBG is greater than 220 mg/dL, you may take  of your  sliding scale (correction) dose of insulin .   HOW TO MANAGE YOUR DIABETES BEFORE AND AFTER SURGERY  Why is it important to control my blood sugar before and after surgery? Improving blood sugar levels before and after surgery helps healing and can limit problems. A way of improving blood sugar control is eating a healthy diet by:  Eating less sugar and carbohydrates  Increasing activity/exercise  Talking with your doctor about reaching your blood sugar goals High blood sugars (greater than 180 mg/dL) can raise your risk of infections and slow your recovery, so you will need to focus on controlling your diabetes during the weeks before surgery. Make sure that the doctor who takes care of your diabetes knows about your planned surgery including the date and location.  How do I manage my blood sugar before surgery? Check your blood sugar at least 4 times a day, starting 2 days before surgery, to make sure that the level is not too high or low.  Check your blood sugar the morning of your surgery when you wake up and every 2 hours until you get to the Short Stay unit.  If your blood sugar is less than 70 mg/dL, you will need to treat for low blood sugar: Do not take insulin . Treat a low blood sugar (less than 70 mg/dL) with  cup of clear juice (cranberry or apple), 4 glucose tablets, OR glucose gel. Recheck blood sugar in 15 minutes after treatment (to make sure it is greater than 70 mg/dL).  If your blood sugar is not greater than 70 mg/dL on recheck, call [phone] for further instructions. Report your blood sugar to the short stay nurse when you get to Short Stay.  If you are admitted to the hospital after surgery: Your blood sugar will be checked by the staff and you will probably be given insulin  after surgery (instead of oral diabetes medicines) to make sure you have good blood sugar levels. The goal for blood sugar control after surgery is 80-180 mg/dL.                      Do not  wear jewelry, make up, or nail polish            Do not wear lotions, powders, perfumes/colognes, or deodorant.            Do not shave 48 hours prior to surgery.  Men may shave face and neck.            Do not bring valuables to the hospital.            [facility] is not responsible for any belongings or valuables.  Do NOT Smoke (Tobacco/Vaping) 24 hours prior to your procedure If you use a CPAP at night, you may bring all equipment for your overnight stay.   Contacts, glasses, dentures or bridgework may not be worn into surgery.      For patients admitted to the hospital, discharge time will be determined by your treatment team.   Patients discharged the day of surgery will not be allowed to drive home, and someone needs to stay with them for 24 hours.    Special instructions:   [place]- Preparing For Surgery  Before surgery, you can play an important role. Because skin is not sterile, your skin needs to be as free of germs as possible. You can reduce the number of germs on your skin by washing with CHG (chlorahexidine gluconate) Soap before surgery.  CHG is an antiseptic cleaner which kills germs and bonds with the skin to continue killing germs even after washing.    Oral Hygiene is also important to reduce your risk of infection.  Remember - BRUSH YOUR TEETH THE MORNING OF SURGERY WITH YOUR REGULAR TOOTHPASTE  Please do not use if you have an allergy to CHG or antibacterial soaps. If your skin becomes reddened/irritated stop using the CHG.  Do not shave (including legs and underarms) for at least 48 hours prior to first CHG shower. It is OK to shave your face.  Please follow these instructions carefully.   Shower the NIGHT BEFORE SURGERY and the MORNING OF SURGERY with DIAL Soap.   Pat yourself dry with a CLEAN TOWEL.  Wear CLEAN PAJAMAS to bed the night before surgery  Place CLEAN SHEETS on your bed the night of your first shower and DO NOT SLEEP WITH PETS.   Day of  Surgery: Please shower morning of surgery  Wear Clean/Comfortable clothing the morning of surgery Do not apply any deodorants/lotions.   Remember to brush your teeth WITH YOUR REGULAR TOOTHPASTE.   Questions were answered. Patient verbalized understanding of instructions.

## 2023-09-28 NOTE — Progress Notes (Signed)
 Dr Paul with anesthesia requested that patient come in for BP check before his surgery scheduled for tomorrow. BP in left arm was 210/93, right arm 208/102. Dr Paul states that the patient's BP is not optimum for surgery in an ambulatory setting. The patient states that he has been on his Amlodipine since last Thursday 09-22-23. Megan at Dr [facility] office made aware.

## 2023-09-30 NOTE — Progress Notes (Addendum)
 Anesthesia Review:  PCP: DR Elsie Gentry  Cardiologist : none   PPM/ ICD: Device Orders: Rep Notified:  Chest x-ray : EKG : 09/21/23  Echo : Stress test: Cardiac Cath :   Activity level: can do a flgiht of stairs without difficulty  Sleep Study/ CPAP : none  Fasting Blood Sugar :      / Checks Blood Sugar -- times a day:    DM- type 2- checks glucose daily per pt   Hgba1c-10/03/23 -  Farxiga- last dose on 10/03/23  Tresiba- Take 1/2 dose in am of surgery.   PT added to his instructon sheet at preop .    Blood Thinner/ Instructions Cherre Dose: ASA / Instructions/ Last Dose :   On 10/03/23-   Blood pressure - 212/104 in right arm and in left arm was 211/102.  Upon recheck blood pressure was 212/103 in right arm .  Manual blood pressure reading in left arm was 180/100.  PT reported he took blood pressure med of Amlodipine 5mg  this am .  PST denies any chest pain, shortness of breath, dizziness , headache or blurred vision.  PT was started on Amlodipine 5mg  on 09/21/23 per pt.  PT surgery was cancelled at a Surgery Center.  SABRA Lins cancelled at [facility].  Cancelled on 09/27/2023 due to blood pressure PT scheduled for [facility] on 10/03/23.  REviewed blood pressure readings with kelly Gekas,PAC.   Per Burnard Odis Barre surgery to move forward  on 10/04/23.  Called pt and LVMM on 10/03/23 to inform pt surgery to be done on 10/03/23 per Burnard Odis BARRE.

## 2023-09-30 NOTE — Patient Instructions (Signed)
 SURGICAL WAITING ROOM VISITATION  Patients having surgery or a procedure may have no more than 2 support people in the waiting area - these visitors may rotate.    Children under the age of 25 must have an adult with them who is not the patient.  Visitors with respiratory illnesses are discouraged from visiting and should remain at home.  If the patient needs to stay at the hospital during part of their recovery, the visitor guidelines for inpatient rooms apply. Pre-op nurse will coordinate an appropriate time for 1 support person to accompany patient in pre-op.  This support person may not rotate.    Please refer to the [facility] website for the visitor guidelines for Inpatients (after your surgery is over and you are in a regular room).       Your procedure is scheduled on:  10/04/23    Report to [facility] Main Entrance    Report to admitting at   1230 pm    Call this number if you have problems the morning of surgery [phone]   Do not eat food :After Midnight.   After Midnight you may have the following liquids until _ 1130_____ AM DAY OF SURGERY  Water Non-Citrus Juices (without pulp, NO RED-Apple, White grape, White cranberry) Black Coffee (NO MILK/CREAM OR CREAMERS, sugar ok)  Clear Tea (NO MILK/CREAM OR CREAMERS, sugar ok) regular and decaf                             Plain Jell-O (NO RED)                                           Fruit ices (not with fruit pulp, NO RED)                                     Popsicles (NO RED)                                                               Sports drinks like Gatorade (NO RED)                   The day of surgery:  Drink ONE (1) Pre-Surgery Clear Ensure or G2 at 1130 AM the morning of surgery. Drink in one sitting. Do not sip.  This drink was given to you during your hospital  pre-op appointment visit. Nothing else to drink after completing the  Pre-Surgery Clear Ensure or G2.          If you have  questions, please contact your surgeon's office.        Oral Hygiene is also important to reduce your risk of infection.                                    Remember - BRUSH YOUR TEETH THE MORNING OF SURGERY WITH YOUR REGULAR TOOTHPASTE  DENTURES WILL BE REMOVED PRIOR TO SURGERY PLEASE DO NOT APPLY Poly grip  OR ADHESIVES!!!   Do NOT smoke after Midnight   Stop all vitamins and herbal supplements 7 days before surgery.   Take these medicines the morning of surgery with A SIP OF WATER:  colchicine, nexium amlodpine,              Farxiga- hold for 72 hours prior to procedure- last dose on             Tresiba-   DO NOT TAKE ANY ORAL DIABETIC MEDICATIONS DAY OF YOUR SURGERY  Bring CPAP mask and tubing day of surgery.                              You may not have any metal on your body including hair pins, jewelry, and body piercing             Do not wear make-up, lotions, powders, perfumes/cologne, or deodorant  Do not wear nail polish including gel and S&S, artificial/acrylic nails, or any other type of covering on natural nails including finger and toenails. If you have artificial nails, gel coating, etc. that needs to be removed by a nail salon please have this removed prior to surgery or surgery may need to be canceled/ delayed if the surgeon/ anesthesia feels like they are unable to be safely monitored.   Do not shave  48 hours prior to surgery.               Men may shave face and neck.   Do not bring valuables to the hospital. [facility] IS NOT             RESPONSIBLE   FOR VALUABLES.   Contacts, glasses, dentures or bridgework may not be worn into surgery.   Bring small overnight bag day of surgery.   DO NOT BRING YOUR HOME MEDICATIONS TO THE HOSPITAL. PHARMACY WILL DISPENSE MEDICATIONS LISTED ON YOUR MEDICATION LIST TO YOU DURING YOUR ADMISSION IN THE HOSPITAL!    Patients discharged on the day of surgery will not be allowed to drive home.  Someone NEEDS to stay  with you for the first 24 hours after anesthesia.   Special Instructions: Bring a copy of your healthcare power of attorney and living will documents the day of surgery if you haven't scanned them before.              Please read over the following fact sheets you were given: IF YOU HAVE QUESTIONS ABOUT YOUR PRE-OP INSTRUCTIONS PLEASE CALL [phone].   If you received a COVID test during your pre-op visit  it is requested that you wear a mask when out in public, stay away from anyone that may not be feeling well and notify your surgeon if you develop symptoms. If you test positive for Covid or have been in contact with anyone that has tested positive in the last 10 days please notify you surgeon.    [facility] - Preparing for Surgery Before surgery, you can play an important role.  Because skin is not sterile, your skin needs to be as free of germs as possible.  You can reduce the number of germs on your skin by washing with CHG (chlorahexidine gluconate) soap before surgery.  CHG is an antiseptic cleaner which kills germs and bonds with the skin to continue killing germs even after washing. Please DO NOT use if you have an allergy to CHG or antibacterial soaps.  If your skin  becomes reddened/irritated stop using the CHG and inform your nurse when you arrive at Short Stay. Do not shave (including legs and underarms) for at least 48 hours prior to the first CHG shower.  You may shave your face/neck. Please follow these instructions carefully:  1.  Shower with CHG Soap the night before surgery and the  morning of Surgery.  2.  If you choose to wash your hair, wash your hair first as usual with your  normal  shampoo.  3.  After you shampoo, rinse your hair and body thoroughly to remove the  shampoo.                           4.  Use CHG as you would any other liquid soap.  You can apply chg directly  to the skin and wash                       Gently with a scrungie or clean washcloth.  5.  Apply the  CHG Soap to your body ONLY FROM THE NECK DOWN.   Do not use on face/ open                           Wound or open sores. Avoid contact with eyes, ears mouth and genitals (private parts).                       Wash face,  Genitals (private parts) with your normal soap.             6.  Wash thoroughly, paying special attention to the area where your surgery  will be performed.  7.  Thoroughly rinse your body with warm water from the neck down.  8.  DO NOT shower/wash with your normal soap after using and rinsing off  the CHG Soap.                9.  Pat yourself dry with a clean towel.            10.  Wear clean pajamas.            11.  Place clean sheets on your bed the night of your first shower and do not  sleep with pets. Day of Surgery : Do not apply any lotions/deodorants the morning of surgery.  Please wear clean clothes to the hospital/surgery center.  FAILURE TO FOLLOW THESE INSTRUCTIONS MAY RESULT IN THE CANCELLATION OF YOUR SURGERY PATIENT SIGNATURE_________________________________  NURSE SIGNATURE__________________________________  ________________________________________________________________________

## 2023-10-03 ENCOUNTER — Other Ambulatory Visit: Payer: Self-pay

## 2023-10-03 ENCOUNTER — Encounter (HOSPITAL_COMMUNITY): Payer: Self-pay

## 2023-10-03 ENCOUNTER — Encounter (HOSPITAL_COMMUNITY)
Admission: RE | Admit: 2023-10-03 | Discharge: 2023-10-03 | Disposition: A | Discharge status: Home / Self Care | Source: Ambulatory Visit | Attending: Orthopedic Surgery | Admitting: Orthopedic Surgery

## 2023-10-03 VITALS — BP 180/100 | HR 80 | Temp 98.5°F | Resp 16 | Wt 195.0 lb

## 2023-10-03 DIAGNOSIS — E1140 Type 2 diabetes mellitus with diabetic neuropathy, unspecified: Secondary | ICD-10-CM | POA: Insufficient documentation

## 2023-10-03 DIAGNOSIS — I7300 Raynaud's syndrome without gangrene: Secondary | ICD-10-CM | POA: Insufficient documentation

## 2023-10-03 DIAGNOSIS — N184 Chronic kidney disease, stage 4 (severe): Secondary | ICD-10-CM | POA: Diagnosis not present

## 2023-10-03 DIAGNOSIS — N2581 Secondary hyperparathyroidism of renal origin: Secondary | ICD-10-CM | POA: Diagnosis not present

## 2023-10-03 DIAGNOSIS — Z794 Long term (current) use of insulin: Secondary | ICD-10-CM | POA: Insufficient documentation

## 2023-10-03 DIAGNOSIS — Z79899 Other long term (current) drug therapy: Secondary | ICD-10-CM | POA: Diagnosis not present

## 2023-10-03 DIAGNOSIS — I129 Hypertensive chronic kidney disease with stage 1 through stage 4 chronic kidney disease, or unspecified chronic kidney disease: Secondary | ICD-10-CM | POA: Diagnosis not present

## 2023-10-03 DIAGNOSIS — E11621 Type 2 diabetes mellitus with foot ulcer: Secondary | ICD-10-CM | POA: Diagnosis not present

## 2023-10-03 DIAGNOSIS — Z89411 Acquired absence of right great toe: Secondary | ICD-10-CM | POA: Diagnosis not present

## 2023-10-03 DIAGNOSIS — E1122 Type 2 diabetes mellitus with diabetic chronic kidney disease: Secondary | ICD-10-CM | POA: Diagnosis not present

## 2023-10-03 DIAGNOSIS — L97519 Non-pressure chronic ulcer of other part of right foot with unspecified severity: Secondary | ICD-10-CM | POA: Insufficient documentation

## 2023-10-03 DIAGNOSIS — I341 Nonrheumatic mitral (valve) prolapse: Secondary | ICD-10-CM | POA: Insufficient documentation

## 2023-10-03 DIAGNOSIS — Z01812 Encounter for preprocedural laboratory examination: Secondary | ICD-10-CM | POA: Insufficient documentation

## 2023-10-03 DIAGNOSIS — M869 Osteomyelitis, unspecified: Secondary | ICD-10-CM | POA: Diagnosis not present

## 2023-10-03 DIAGNOSIS — K2270 Barrett's esophagus without dysplasia: Secondary | ICD-10-CM | POA: Diagnosis not present

## 2023-10-03 DIAGNOSIS — E1169 Type 2 diabetes mellitus with other specified complication: Secondary | ICD-10-CM | POA: Diagnosis not present

## 2023-10-03 DIAGNOSIS — E785 Hyperlipidemia, unspecified: Secondary | ICD-10-CM | POA: Diagnosis not present

## 2023-10-03 DIAGNOSIS — K219 Gastro-esophageal reflux disease without esophagitis: Secondary | ICD-10-CM | POA: Insufficient documentation

## 2023-10-03 DIAGNOSIS — Z01818 Encounter for other preprocedural examination: Secondary | ICD-10-CM | POA: Diagnosis present

## 2023-10-03 DIAGNOSIS — E1165 Type 2 diabetes mellitus with hyperglycemia: Secondary | ICD-10-CM | POA: Insufficient documentation

## 2023-10-03 DIAGNOSIS — N1832 Chronic kidney disease, stage 3b: Secondary | ICD-10-CM

## 2023-10-03 LAB — BASIC METABOLIC PANEL WITH GFR
Anion gap: 8 (ref 5–15)
BUN: 67 mg/dL — ABNORMAL HIGH (ref 8–23)
CO2: 17 mmol/L — ABNORMAL LOW (ref 22–32)
Calcium: 8.5 mg/dL — ABNORMAL LOW (ref 8.9–10.3)
Chloride: 114 mmol/L — ABNORMAL HIGH (ref 98–111)
Creatinine, Ser: 3.97 mg/dL — ABNORMAL HIGH (ref 0.61–1.24)
GFR, Estimated: 15 mL/min — ABNORMAL LOW (ref >60)
Glucose, Bld: 104 mg/dL — ABNORMAL HIGH (ref 70–99)
Potassium: 4.7 mmol/L (ref 3.5–5.1)
Sodium: 139 mmol/L (ref 135–145)

## 2023-10-03 LAB — CBC
HCT: 34.2 % — ABNORMAL LOW (ref 39.0–52.0)
Hemoglobin: 11 g/dL — ABNORMAL LOW (ref 13.0–17.0)
MCH: 29.3 pg (ref 26.0–34.0)
MCHC: 32.2 g/dL (ref 30.0–36.0)
MCV: 91 fL (ref 80.0–100.0)
Platelets: 156 K/uL (ref 150–400)
RBC: 3.76 MIL/uL — ABNORMAL LOW (ref 4.22–5.81)
RDW: 13.2 % (ref 11.5–15.5)
WBC: 8 K/uL (ref 4.0–10.5)
nRBC: 0 % (ref 0.0–0.2)

## 2023-10-03 LAB — GLUCOSE, CAPILLARY: Glucose-Capillary: 103 mg/dL — ABNORMAL HIGH (ref 70–99)

## 2023-10-03 LAB — HEMOGLOBIN A1C
Hgb A1c MFr Bld: 5.3 % (ref 4.8–5.6)
Mean Plasma Glucose: 105.41 mg/dL

## 2023-10-03 NOTE — Progress Notes (Signed)
 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  Case was posted at [facility] outpatient surgery center on 09/21/23 and canceled due to high BP (BP 234/109).   Pt rescheduled for surgery at [facility] main OR on 8/19 but then rescheduled to [facility] OR for 8/26 for unknown reasons. See PAT note by Isaiah Ruder, PA-C on 8/17.  At PAT visit on 8/25 BP was 211/102. Manual recheck was 180/100. Pt took Amlodipine this morning. Denies any symptoms. States he feels fine.   Seen by PCP on 09/07/23. Per Dr. Loreli: Creatinine 3.19 (GFR 20) in 6/24-> 3.8 (GFR 15.8) in 7/25. Continue close follow-up with Dr. Dennise (nephrology) for worsening chronic kidney disease. Continue Farxiga. He self discontinued losartan due to fatigue and he is at increased risk of hyperkalemia. Kerendia discontinued by Dr. Dennise Due to hyperkalemia. He continues to decline any further discussion regarding dialysis or transplant though with trajectory I am concerned that this may become an acute issue in the future. I encouraged him to proceed with transplant clinic evaluation and discussion regarding dialysis access to prevent crash start if it comes to this. In the past, he has indicated that he would not want dialysis regardless.  Discussed  with Dr. JONELLE Needle who states ok to proceed due to urgency of case.   VS: BP (!) 180/100   Pulse 80   Temp 36.9 C (Oral)   Resp 16   Wt 88.5 kg   SpO2 100%   BMI 25.73 kg/m   PROVIDERS: Loreli Elsie JONETTA Mickey., MD Dennise Hoes, MD is nephrologist Renda Glance, MD is urologist Rosalie Kitchens, MD is GI   LABS: Labs reviewed: Acceptable for surgery. (all labs ordered are listed, but only abnormal results are displayed)  Labs Reviewed  GLUCOSE, CAPILLARY - Abnormal; Notable for the following components:      Result Value   Glucose-Capillary 103 (*)    All other components within normal limits  CBC  BASIC METABOLIC PANEL WITH GFR  HEMOGLOBIN A1C     IMAGES:   EKG 09/21/23: NSR   CV:  Past Medical History:  Diagnosis Date   Barrett's esophagus    surveillance EGD in May   GERD (gastroesophageal reflux disease)    Gout    Hypertension    MVP (mitral valve prolapse)    Raynaud's disease    Stage 3b chronic kidney disease (CKD) (HCC)    CKD progressed to stg 4   Ulcer of foot due to diabetes (HCC)    Uncontrolled diabetes mellitus with hyperglycemia, with long-term current use of insulin  [facility])     Past Surgical History:  Procedure Laterality Date   AMPUTATION TOE Right 01/20/2021   Procedure: Right hallux amputation through the proximal phalanx;  Surgeon: Kit Rush, MD;  Location: [facility];  Service: Orthopedics;  Laterality: Right;    I & D EXTREMITY Right 04/23/2021   Procedure: AMPUTATION RIGHT GREAT TOE;  Surgeon: Kit Rush, MD;  Location: [facility] OR;  Service: Orthopedics;  Laterality: Right;   right knee scope       MEDICATIONS:  colchicine 0.6 MG tablet   dapagliflozin propanediol (FARXIGA) 10 MG TABS tablet   esomeprazole (NEXIUM) 40 MG capsule   furosemide  (LASIX ) 40 MG tablet   insulin  degludec (TRESIBA FLEXTOUCH) 200 UNIT/ML FlexTouch Pen   No current facility-administered medications for this encounter.   [name] [facility] Short Stay/Anesthesiology [facility] Phone [phone] 10/03/2023 3:21 PM

## 2023-10-03 NOTE — Anesthesia Preprocedure Evaluation (Addendum)
 Anesthesia Evaluation  Patient identified by MRN, date of birth, ID band Patient awake    Reviewed: Allergy & Precautions, NPO status , Patient's Chart, lab work & pertinent test results  History of Anesthesia Complications Negative for: history of anesthetic complications  Airway Mallampati: II  TM Distance: >3 FB Neck ROM: Full    Dental no notable dental hx.    Pulmonary neg pulmonary ROS   Pulmonary exam normal        Cardiovascular hypertension (poorly controlled), Pt. on medications Normal cardiovascular exam     Neuro/Psych negative neurological ROS     GI/Hepatic Neg liver ROS,GERD  Medicated and Controlled,,  Endo/Other  diabetes, Type 2, Insulin  Dependent, Oral Hypoglycemic Agents    Renal/GU Renal InsufficiencyRenal disease (Cr 3.97)  negative genitourinary   Musculoskeletal negative musculoskeletal ROS (+)    Abdominal   Peds  Hematology  (+) Blood dyscrasia (Hgb 11.0), anemia   Anesthesia Other Findings Day of surgery medications reviewed with patient.  Reproductive/Obstetrics negative OB ROS                              Anesthesia Physical Anesthesia Plan  ASA: 4  Anesthesia Plan: MAC   Post-op Pain Management: Tylenol  PO (pre-op)*   Induction:   PONV Risk Score and Plan: 1 and Treatment may vary due to age or medical condition, Propofol  infusion, Ondansetron  and Midazolam   Airway Management Planned: Natural Airway and Simple Face Mask  Additional Equipment: None  Intra-op Plan:   Post-operative Plan:   Informed Consent: I have reviewed the patients History and Physical, chart, labs and discussed the procedure including the risks, benefits and alternatives for the proposed anesthesia with the patient or authorized representative who has indicated his/her understanding and acceptance.       Plan Discussed with: CRNA  Anesthesia Plan Comments: (See PAT  note from 8/25)         Anesthesia Quick Evaluation

## 2023-10-03 NOTE — H&P (Signed)
  Chief Complaint: Right 2nd toe diabetic ulcer HPI: Patient is a 68 year old male that presents to the OR today for definitive treatment for his chronic right second toe ulcer and osteomyelitis.  He has failed conservative treatment consisting of oral antibiotics and activity modification, and elects for surgical intervention.  Allergies:  Allergies  Allergen Reactions   Erythromycin Hives    Past Medical History:  Diagnosis Date   Barrett's esophagus    surveillance EGD in May   GERD (gastroesophageal reflux disease)    Gout    Hypertension    MVP (mitral valve prolapse)    Raynaud's disease    Stage 3b chronic kidney disease (CKD) (HCC)    CKD progressed to stg 4   Ulcer of foot due to diabetes (HCC)    Uncontrolled diabetes mellitus with hyperglycemia, with long-term current use of insulin  [facility])     Past Surgical History:  Procedure Laterality Date   AMPUTATION TOE Right 01/20/2021   Procedure: Right hallux amputation through the proximal phalanx;  Surgeon: Kit Rush, MD;  Location: [facility];  Service: Orthopedics;  Laterality: Right;    I & D EXTREMITY Right 04/23/2021   Procedure: AMPUTATION RIGHT GREAT TOE;  Surgeon: Kit Rush, MD;  Location[facility];  Service: Orthopedics;  Laterality: Right;    Family History: No family history on file.  Social History:   reports that he has never smoked. He has never used smokeless tobacco. He reports that he does not drink alcohol and does not use drugs.  Medications: No medications prior to admission.    No results found for this or any previous visit (from the past 48 hours).  No results found.    There were no vitals taken for this visit.  PE:  well nourished and well developed.  NAD.  EOMI.  Resp unlabored.  Right 2nd hammertoe deformity with ulcer at distal aspect of toe.  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  He has failed conservative treatment consisting of oral antibiotics and activity modification, and elects for surgical intervention.  Eva Barrack PA-C [facility] Office:  [phone]

## 2023-10-04 ENCOUNTER — Ambulatory Visit (HOSPITAL_COMMUNITY)
Admission: RE | Admit: 2023-10-04 | Discharge: 2023-10-04 | Disposition: A | Discharge status: Home / Self Care | Attending: Orthopedic Surgery | Admitting: Orthopedic Surgery

## 2023-10-04 ENCOUNTER — Encounter (HOSPITAL_COMMUNITY)
Admission: RE | Disposition: A | Discharge status: Home / Self Care | Payer: Self-pay | Source: Home / Self Care | Attending: Orthopedic Surgery

## 2023-10-04 ENCOUNTER — Ambulatory Visit (HOSPITAL_COMMUNITY): Payer: Self-pay | Admitting: Medical

## 2023-10-04 ENCOUNTER — Encounter (HOSPITAL_COMMUNITY): Payer: Self-pay | Admitting: Orthopedic Surgery

## 2023-10-04 ENCOUNTER — Ambulatory Visit (HOSPITAL_BASED_OUTPATIENT_CLINIC_OR_DEPARTMENT_OTHER): Payer: Self-pay | Admitting: Anesthesiology

## 2023-10-04 DIAGNOSIS — Z7984 Long term (current) use of oral hypoglycemic drugs: Secondary | ICD-10-CM

## 2023-10-04 DIAGNOSIS — E1165 Type 2 diabetes mellitus with hyperglycemia: Secondary | ICD-10-CM

## 2023-10-04 DIAGNOSIS — N189 Chronic kidney disease, unspecified: Secondary | ICD-10-CM | POA: Diagnosis not present

## 2023-10-04 DIAGNOSIS — L97509 Non-pressure chronic ulcer of other part of unspecified foot with unspecified severity: Secondary | ICD-10-CM | POA: Diagnosis present

## 2023-10-04 DIAGNOSIS — E11621 Type 2 diabetes mellitus with foot ulcer: Secondary | ICD-10-CM | POA: Insufficient documentation

## 2023-10-04 DIAGNOSIS — I10 Essential (primary) hypertension: Secondary | ICD-10-CM

## 2023-10-04 DIAGNOSIS — E11628 Type 2 diabetes mellitus with other skin complications: Secondary | ICD-10-CM

## 2023-10-04 DIAGNOSIS — E1169 Type 2 diabetes mellitus with other specified complication: Secondary | ICD-10-CM | POA: Diagnosis present

## 2023-10-04 DIAGNOSIS — M869 Osteomyelitis, unspecified: Secondary | ICD-10-CM

## 2023-10-04 DIAGNOSIS — K219 Gastro-esophageal reflux disease without esophagitis: Secondary | ICD-10-CM | POA: Insufficient documentation

## 2023-10-04 DIAGNOSIS — E1122 Type 2 diabetes mellitus with diabetic chronic kidney disease: Secondary | ICD-10-CM | POA: Insufficient documentation

## 2023-10-04 DIAGNOSIS — Z794 Long term (current) use of insulin: Secondary | ICD-10-CM | POA: Diagnosis not present

## 2023-10-04 DIAGNOSIS — L97519 Non-pressure chronic ulcer of other part of right foot with unspecified severity: Secondary | ICD-10-CM | POA: Insufficient documentation

## 2023-10-04 DIAGNOSIS — N1832 Chronic kidney disease, stage 3b: Secondary | ICD-10-CM

## 2023-10-04 DIAGNOSIS — Z01818 Encounter for other preprocedural examination: Secondary | ICD-10-CM

## 2023-10-04 HISTORY — PX: AMPUTATION TOE: SHX6595

## 2023-10-04 LAB — GLUCOSE, CAPILLARY
Glucose-Capillary: 123 mg/dL — ABNORMAL HIGH (ref 70–99)
Glucose-Capillary: 138 mg/dL — ABNORMAL HIGH (ref 70–99)

## 2023-10-04 SURGERY — AMPUTATION, TOE
Anesthesia: Monitor Anesthesia Care | Site: Toe | Laterality: Right

## 2023-10-04 MED ORDER — BUPIVACAINE-EPINEPHRINE (PF) 0.5% -1:200000 IJ SOLN
INTRAMUSCULAR | Status: AC
  Filled 2023-10-04: qty 30

## 2023-10-04 MED ORDER — PROPOFOL 500 MG/50ML IV EMUL
INTRAVENOUS | Status: DC | PRN
  Administered 2023-10-04: 140 ug/kg/min via INTRAVENOUS

## 2023-10-04 MED ORDER — CHLORHEXIDINE GLUCONATE 0.12 % MT SOLN
15.0000 mL | Freq: Once | OROMUCOSAL | Status: AC
  Administered 2023-10-04: 15 mL via OROMUCOSAL

## 2023-10-04 MED ORDER — LIDOCAINE HCL 2 % IJ SOLN
INTRAMUSCULAR | Status: AC
  Filled 2023-10-04: qty 20

## 2023-10-04 MED ORDER — LACTATED RINGERS IV SOLN: INTRAVENOUS | Status: DC

## 2023-10-04 MED ORDER — SODIUM CHLORIDE 0.9 % IV SOLN: INTRAVENOUS | Status: DC

## 2023-10-04 MED ORDER — CEFAZOLIN SODIUM-DEXTROSE 2-4 GM/100ML-% IV SOLN
2.0000 g | INTRAVENOUS | Status: AC
  Administered 2023-10-04: 2 g via INTRAVENOUS
  Filled 2023-10-04: qty 100

## 2023-10-04 MED ORDER — FENTANYL CITRATE PF 50 MCG/ML IJ SOSY: 25.0000 ug | PREFILLED_SYRINGE | INTRAMUSCULAR | Status: DC | PRN

## 2023-10-04 MED ORDER — DOXYCYCLINE HYCLATE 100 MG PO TABS: 100.0000 mg | ORAL_TABLET | Freq: Two times a day (BID) | ORAL | 0 refills | Status: AC

## 2023-10-04 MED ORDER — MIDAZOLAM HCL 2 MG/2ML IJ SOLN
INTRAMUSCULAR | Status: AC
Start: 2023-10-04 — End: 2023-10-04
  Filled 2023-10-04: qty 2

## 2023-10-04 MED ORDER — 0.9 % SODIUM CHLORIDE (POUR BTL) OPTIME
TOPICAL | Status: DC | PRN
  Administered 2023-10-04: 1000 mL

## 2023-10-04 MED ORDER — FENTANYL CITRATE (PF) 100 MCG/2ML IJ SOLN
INTRAMUSCULAR | Status: AC
  Filled 2023-10-04: qty 2

## 2023-10-04 MED ORDER — FENTANYL CITRATE (PF) 100 MCG/2ML IJ SOLN
INTRAMUSCULAR | Status: DC | PRN
  Administered 2023-10-04 (×2): 25 ug via INTRAVENOUS

## 2023-10-04 MED ORDER — OXYCODONE HCL 5 MG PO TABS: 5.0000 mg | ORAL_TABLET | Freq: Once | ORAL | Status: DC | PRN

## 2023-10-04 MED ORDER — VANCOMYCIN HCL 500 MG IV SOLR
INTRAVENOUS | Status: DC | PRN
  Administered 2023-10-04: 1000 mg via TOPICAL

## 2023-10-04 MED ORDER — VANCOMYCIN HCL 1000 MG IV SOLR
INTRAVENOUS | Status: AC
  Filled 2023-10-04: qty 20

## 2023-10-04 MED ORDER — ACETAMINOPHEN 500 MG PO TABS
1000.0000 mg | ORAL_TABLET | Freq: Once | ORAL | Status: AC
  Administered 2023-10-04: 1000 mg via ORAL
  Filled 2023-10-04: qty 2

## 2023-10-04 MED ORDER — MIDAZOLAM HCL 5 MG/5ML IJ SOLN
INTRAMUSCULAR | Status: DC | PRN
  Administered 2023-10-04 (×2): 1 mg via INTRAVENOUS

## 2023-10-04 MED ORDER — ORAL CARE MOUTH RINSE: 15.0000 mL | Freq: Once | OROMUCOSAL | Status: AC

## 2023-10-04 MED ORDER — INSULIN ASPART 100 UNIT/ML IJ SOLN: 0.0000 [IU] | INTRAMUSCULAR | Status: DC | PRN

## 2023-10-04 MED ORDER — DROPERIDOL 2.5 MG/ML IJ SOLN: 0.6250 mg | Freq: Once | INTRAMUSCULAR | Status: DC | PRN

## 2023-10-04 MED ORDER — OXYCODONE HCL 5 MG/5ML PO SOLN: 5.0000 mg | Freq: Once | ORAL | Status: DC | PRN

## 2023-10-04 MED ORDER — BUPIVACAINE-EPINEPHRINE 0.5% -1:200000 IJ SOLN
INTRAMUSCULAR | Status: DC | PRN
  Administered 2023-10-04: 10 mL

## 2023-10-04 SURGICAL SUPPLY — 43 items
BAG COUNTER SPONGE SURGICOUNT (BAG) IMPLANT
BLADE SURG 15 STRL LF DISP TIS (BLADE) ×1 IMPLANT
BLADE SW THK.38XMED LNG THN (BLADE) IMPLANT
BNDG COHESIVE 4X5 TAN STRL LF (GAUZE/BANDAGES/DRESSINGS) ×1 IMPLANT
BNDG ESMARK 4X9 LF (GAUZE/BANDAGES/DRESSINGS) ×1 IMPLANT
BNDG STRETCH GAUZE 3IN X12FT (GAUZE/BANDAGES/DRESSINGS) IMPLANT
COVER BACK TABLE 60X90IN (DRAPES) ×1 IMPLANT
CUFF TRNQT CYL 24X4X16.5-23 (TOURNIQUET CUFF) IMPLANT
DRAPE SHEET LG 3/4 BI-LAMINATE (DRAPES) ×2 IMPLANT
DRAPE U-SHAPE 47X51 STRL (DRAPES) ×1 IMPLANT
DRSG EMULSION OIL 3X3 NADH (GAUZE/BANDAGES/DRESSINGS) IMPLANT
DRSG MEPITEL 4X7.2 (GAUZE/BANDAGES/DRESSINGS) ×1 IMPLANT
DURAPREP 26ML APPLICATOR (WOUND CARE) ×1 IMPLANT
ELECT REM PT RETURN 15FT ADLT (MISCELLANEOUS) ×1 IMPLANT
GAUZE PAD ABD 8X10 STRL (GAUZE/BANDAGES/DRESSINGS) IMPLANT
GAUZE SPONGE 4X4 12PLY STRL (GAUZE/BANDAGES/DRESSINGS) ×1 IMPLANT
GLOVE BIO SURGEON STRL SZ8 (GLOVE) ×1 IMPLANT
GLOVE BIOGEL PI IND STRL 8 (GLOVE) ×2 IMPLANT
GLOVE ECLIPSE 8.0 STRL XLNG CF (GLOVE) ×1 IMPLANT
GOWN STRL REUS W/ TWL LRG LVL3 (GOWN DISPOSABLE) ×1 IMPLANT
GOWN STRL REUS W/ TWL XL LVL3 (GOWN DISPOSABLE) ×1 IMPLANT
KIT BASIN OR (CUSTOM PROCEDURE TRAY) ×1 IMPLANT
KIT TURNOVER KIT A (KITS) ×1 IMPLANT
NDL HYPO 25X1 1.5 SAFETY (NEEDLE) IMPLANT
NDL SAFETY ECLIPSE 18X1.5 (NEEDLE) IMPLANT
NEEDLE HYPO 25X1 1.5 SAFETY (NEEDLE) ×1 IMPLANT
NS IRRIG 1000ML POUR BTL (IV SOLUTION) ×1 IMPLANT
PAD CAST 3X4 CTTN HI CHSV (CAST SUPPLIES) IMPLANT
PENCIL SMOKE EVACUATOR (MISCELLANEOUS) IMPLANT
PROTECTOR NERVE ULNAR (MISCELLANEOUS) ×1 IMPLANT
SANITIZER HAND PURELL 535ML FO (MISCELLANEOUS) ×1 IMPLANT
SPIKE FLUID TRANSFER (MISCELLANEOUS) IMPLANT
STOCKINETTE 6 STRL (DRAPES) ×1 IMPLANT
SUCTION TUBE FRAZIER 10FR DISP (SUCTIONS) IMPLANT
SUT ETHILON 2 0 PS N (SUTURE) IMPLANT
SUT ETHILON 2 0 PSLX (SUTURE) ×1 IMPLANT
SUT MNCRL AB 3-0 PS2 18 (SUTURE) IMPLANT
SWAB COLLECTION DEVICE MRSA (MISCELLANEOUS) IMPLANT
SWAB CULTURE ESWAB REG 1ML (MISCELLANEOUS) IMPLANT
SYR BULB EAR ULCER 3OZ GRN STR (SYRINGE) ×1 IMPLANT
SYR CONTROL 10ML LL (SYRINGE) IMPLANT
TUBING CONNECTING 10 (TUBING) IMPLANT
UNDERPAD 30X36 HEAVY ABSORB (UNDERPADS AND DIAPERS) ×1 IMPLANT

## 2023-10-04 NOTE — H&P (Signed)
 [name] [name] is an 68 y.o. male.   Chief Complaint: right 2nd toe diabetic ulcer HPI: 68 y/o male with htn, ckd and diabetes has a h/o right foot 2nd toe diabetic foot ulcer for many months.  The ulcer worsened after a recent trip requiring oral abx.  Xrays and an MRI show osteomyelitis of the toe.  He presents today for right foot 2nd toe amputation.  Past Medical History:  Diagnosis Date   Barrett's esophagus    surveillance EGD in May   GERD (gastroesophageal reflux disease)    Gout    Hypertension    MVP (mitral valve prolapse)    Raynaud's disease    Stage 3b chronic kidney disease (CKD) (HCC)    CKD progressed to stg 4   Ulcer of foot due to diabetes (HCC)    Uncontrolled diabetes mellitus with hyperglycemia, with long-term current use of insulin  [facility])     Past Surgical History:  Procedure Laterality Date   AMPUTATION TOE Right 01/20/2021   Procedure: Right hallux amputation through the proximal phalanx;  Surgeon: Kit Rush, MD;  Location:  SURGERY CENTER;  Service: Orthopedics;  Laterality: Right;    I & D EXTREMITY Right 04/23/2021   Procedure: AMPUTATION RIGHT GREAT TOE;  Surgeon: Kit Rush, MD;  [facility];  Service: Orthopedics;  Laterality: Right;   right knee scope       History reviewed. No pertinent family history. Social History:  reports that he has never smoked. He has never used smokeless tobacco. He reports that he does not drink alcohol and does not use drugs.  Allergies:  Allergies  Allergen Reactions   Erythromycin Hives    Medications Prior to Admission  Medication Sig Dispense Refill   amLODipine (NORVASC) 5 MG tablet Take 5 mg by mouth daily.     colchicine 0.6 MG tablet Take 0.6 mg by mouth daily.     dapagliflozin propanediol (FARXIGA) 10 MG TABS tablet Take 10 mg by mouth daily.     esomeprazole (NEXIUM) 40 MG capsule Take 40 mg by mouth daily.     insulin  degludec (TRESIBA FLEXTOUCH) 200 UNIT/ML FlexTouch Pen Inject  16 Units into the skin daily.     furosemide  (LASIX ) 40 MG tablet Take 40 mg by mouth daily.      Results for orders placed or performed during the hospital encounter of 10/04/23 (from the past 48 hours)  Glucose, capillary     Status: Abnormal   Collection Time: 10/04/23  6:38 AM  Result Value Ref Range   Glucose-Capillary 123 (H) 70 - 99 mg/dL    Comment: Glucose reference range applies only to samples taken after fasting for at least 8 hours.   No results found.  Review of Systems  no recent f/c/n/v/wt loss  Blood pressure (!) 200/70, pulse 79, temperature 98.5 F (36.9 C), temperature source Oral, resp. rate 18, height 6' (1.829 m), weight 88.5 kg, SpO2 99%. Physical Exam  Wn wd male in nad.  A and O.  EOMI.  Resp unlabored.  R foot with severe 2nd hammertoe with dorsal ulcer.  Swelling around the toe.  No lymphadenopathy.  Brisk cap refill at the forefoot.  ACtive PF and DF of the ankle and toes.  Assessment/Plan R foot 2nd toe diabetic foot ulcer and osteomyelitis - to the OR today for 2nd toe amputation.  The risks and benefits of the alternative treatment options have been discussed in detail.  The patient wishes to proceed  with surgery and specifically understands risks of bleeding, infection, nerve damage, blood clots, need for additional surgery, amputation and death.   Norleen Armor, MD 10/26/23, 7:16 AM

## 2023-10-04 NOTE — Progress Notes (Signed)
 Orthopedic Tech Progress Note Patient Details:  [name] [name] 1955/08/03 [id number]  Ortho Devices Type of Ortho Device: Postop shoe/boot Ortho Device/Splint Location: right Ortho Device/Splint Interventions: Ordered, Application, Adjustment   Post Interventions Patient Tolerated: Well Instructions Provided: Adjustment of device, Care of device  [name] 10/04/2023, 8:42 AM

## 2023-10-04 NOTE — Op Note (Signed)
 10/04/2023  7:51 AM  PATIENT:  [name]  68 y.o. [name]  PRE-OPERATIVE DIAGNOSIS: Right foot second toe diabetic foot ulcer with osteomyelitis  POST-OPERATIVE DIAGNOSIS: Same  Procedure(s): Right foot second toe amputation through the MTP joint  SURGEON:  Norleen Armor, MD  ASSISTANT: Eva Barrack, PA-C  ANESTHESIA:   Local, MAC  EBL:  minimal   TOURNIQUET: Approximately 10 minutes with an ankle Esmarch  COMPLICATIONS:  None apparent  DISPOSITION:  Extubated, awake and stable to recovery.  INDICATION FOR PROCEDURE: 68 year old [name] with past medical history significant for diabetes, hypertension and chronic kidney disease has a many month history of diabetic foot ulcer involving the second toe.  After a recent trip he developed cellulitis.  Radiographs and an MRI reveal underlying osteomyelitis.  He presents today for right foot second toe amputation.  The risks and benefits of the alternative treatment options have been discussed in detail.  The patient wishes to proceed with surgery and specifically understands risks of bleeding, infection, nerve damage, blood clots, need for additional surgery, amputation and death.   PROCEDURE IN DETAIL:  After pre operative consent was obtained, and the correct operative site was identified, the patient was brought to the operating room and placed supine on the OR table.  Anesthesia was administered.  Pre-operative antibiotics were administered.  A surgical timeout was taken.  The second toe was anesthetized with a metatarsal block with Marcaine  with epinephrine .  The right lower extremity was prepped and draped in standard sterile fashion.  The foot was exsanguinated and a 4 inch Esmarch tourniquet wrapped around the ankle.  A plantar flap incision was made around the base of the toe.  Dissection was carried sharply down to the bone of the proximal phalanx.  The toe was disarticulated through the MTP joint and passed off the field.  The wound was  irrigated copiously.  All vascular structures were cauterized.  The wound was sprinkled with vancomycin  powder.  The incision was closed with simple and horizontal mattress sutures of [name]-0 nylon.  Sterile dressings were applied followed by compression wrap.  The tourniquet was released after application of the dressings.  The patient was awakened from anesthesia and transported to the recovery room in stable condition.  FOLLOW UP PLAN: Weightbearing as tolerated in a flat postop shoe.  Follow-up in the office in [name] weeks for suture removal.  Continue doxycycline  for 1 week postop.  No indication for DVT prophylaxis in this ambulatory patient.    Justin Ollis PA-C was present and scrubbed for the duration of the operative case. His assistance was essential in positioning the patient, prepping and draping, gaining and maintaining exposure, performing the operation, closing and dressing the wounds and applying the splint.

## 2023-10-04 NOTE — Transfer of Care (Signed)
 Immediate Anesthesia Transfer of Care Note  Patient: [name] [name]  Procedure(s) Performed: AMPUTATION, TOE (Right: Toe)  Patient Location: PACU  Anesthesia Type:MAC  Level of Consciousness: drowsy  Airway & Oxygen Therapy: Patient Spontanous Breathing  Post-op Assessment: Report given to RN  Post vital signs: Reviewed and stable  Last Vitals:  Vitals Value Taken Time  BP 124/61 10/04/23 08:00  Temp    Pulse 71 10/04/23 08:03  Resp 18 10/04/23 08:03  SpO2 100 % 10/04/23 08:03  Vitals shown include unfiled device data.  Last Pain:  Vitals:   10/04/23 0606  TempSrc: Oral  PainSc:          Complications: No notable events documented.

## 2023-10-04 NOTE — Discharge Instructions (Addendum)
 Norleen Armor, MD [facility]  Please read the following information regarding your care after surgery.  Medications  You only need a prescription for the narcotic pain medicine (ex. oxycodone , Percocet, Norco).  All of the other medicines listed below are available over the counter. ? acetominophen (Tylenol ) 650 mg every 4-6 hours as you need for minor to moderate pain ? doxycyline as prescribed x 7 days.   Weight Bearing ? Bear weight only on your operated foot in the post-op shoe.   Cast / Splint / Dressing ? Keep your splint, cast or dressing clean and dry.  Don't put anything (coat hanger, pencil, etc) down inside of it.  If it gets damp, use a hair dryer on the cool setting to dry it.  If it gets soaked, call the office to schedule an appointment for a cast change.  After your dressing, cast or splint is removed; you may shower, but do not soak or scrub the wound.  Allow the water to run over it, and then gently pat it dry.  Swelling It is normal for you to have swelling where you had surgery.  To reduce swelling and pain, keep your toes above your nose for at least 3 days after surgery.  It may be necessary to keep your foot or leg elevated for several weeks.  If it hurts, it should be elevated.  Follow Up Call my office at [phone] when you are discharged from the hospital or surgery center to schedule an appointment to be seen two weeks after surgery.  Call my office at [phone] if you develop a fever >101.5 F, nausea, vomiting, bleeding from the surgical site or severe pain.

## 2023-10-04 NOTE — Anesthesia Postprocedure Evaluation (Signed)
 Anesthesia Post Note  Patient: [name] [name]  Procedure(s) Performed: AMPUTATION, TOE (Right: Toe)     Patient location during evaluation: PACU Anesthesia Type: MAC Level of consciousness: awake and alert Pain management: pain level controlled Vital Signs Assessment: post-procedure vital signs reviewed and stable Respiratory status: spontaneous breathing, nonlabored ventilation and respiratory function stable Cardiovascular status: blood pressure returned to baseline Postop Assessment: no apparent nausea or vomiting Anesthetic complications: no   No notable events documented.  Last Vitals:  Vitals:   10/04/23 0815 10/04/23 0830  BP: (!) 141/76 (!) 153/72  Pulse: 66 70  Resp: 10 12  Temp:  36.5 C  SpO2: 100% 96%    Last Pain:  Vitals:   10/04/23 0830  TempSrc:   PainSc: 0-No pain                 Vertell Row

## 2023-10-05 ENCOUNTER — Encounter (HOSPITAL_COMMUNITY): Payer: Self-pay | Admitting: Orthopedic Surgery

## 2023-12-01 ENCOUNTER — Other Ambulatory Visit: Payer: Self-pay | Admitting: *Deleted

## 2023-12-01 DIAGNOSIS — N186 End stage renal disease: Secondary | ICD-10-CM

## 2023-12-22 NOTE — Progress Notes (Unsigned)
 Patient ID: [name] [name], male   DOB: 06-07-1955, 68 y.o.   MRN: [id number]  Reason for Consult: No chief complaint on file.   Referred by Loreli Elsie JONETTA Mickey., MD  Subjective:     HPI [name] [name] is a 68 y.o. male who presents for evaluation HD access creation.  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 Past Surgical History:  Procedure Laterality Date   AMPUTATION TOE Right 01/20/2021   Procedure: Right hallux amputation through the proximal phalanx;  Surgeon: Kit Norleen, MD;  Location: [facility];  Service: Orthopedics;  Laterality: Right;    AMPUTATION TOE Right 10/04/2023   Procedure: AMPUTATION, TOE;  Surgeon: Kit Norleen, MD;  Location: [facility];  Service: Orthopedics;  Laterality: Right;  2nd toe   I & D EXTREMITY Right 04/23/2021   Procedure: AMPUTATION RIGHT GREAT TOE;  Surgeon: Kit Norleen, MD;  Location: [facility];  Service: Orthopedics;  Laterality: Right;   right knee scope       Short Social History:  Social History   Tobacco Use   Smoking status: Never   Smokeless tobacco: Never  Substance Use Topics   Alcohol use: Never    Allergies  Allergen Reactions   Erythromycin Hives    Current Outpatient Medications  Medication Sig Dispense Refill   amLODipine (NORVASC) 5 MG tablet Take 5 mg by mouth daily.     colchicine 0.6 MG tablet Take 0.6 mg by mouth daily.     dapagliflozin propanediol (FARXIGA) 10 MG TABS tablet Take 10 mg by mouth daily.     esomeprazole (NEXIUM) 40  MG capsule Take 40 mg by mouth daily.     furosemide  (LASIX ) 40 MG tablet Take 40 mg by mouth daily.     insulin  degludec (TRESIBA FLEXTOUCH) 200 UNIT/ML FlexTouch Pen Inject 16 Units into the skin daily.     No current facility-administered medications for this visit.    REVIEW OF SYSTEMS All other systems were reviewed and are negative    Objective:  Objective   There were no vitals filed for this visit. There is no height or weight on file to calculate BMI.  Physical Exam General: no acute distress Cardiac: hemodynamically stable Extremities: No edema or cyanosis Vascular:   Right: palpable brachial, radial  Left: palpable brachial, radial   Data: UE arterial duplex Triphasic throughout   Vein mapping +-----------------+-------------+----------+--------------+  Right Cephalic   Diameter (cm)Depth (cm)   Findings     +-----------------+-------------+----------+--------------+  Shoulder                               not visualized  +-----------------+-------------+----------+--------------+  Prox upper arm       0.31                               +-----------------+-------------+----------+--------------+  Mid upper arm  0.25                               +-----------------+-------------+----------+--------------+  Dist upper arm       0.27                               +-----------------+-------------+----------+--------------+  Antecubital fossa    0.19                               +-----------------+-------------+----------+--------------+  Prox forearm         0.13                               +-----------------+-------------+----------+--------------+  Mid forearm          0.16                               +-----------------+-------------+----------+--------------+  Dist forearm         0.10                               +-----------------+-------------+----------+--------------+    +-----------------+-------------+----------+--------------+  Right Basilic    Diameter (cm)Depth (cm)   Findings     +-----------------+-------------+----------+--------------+  Prox upper arm       0.45                               +-----------------+-------------+----------+--------------+  Mid upper arm        0.51                               +-----------------+-------------+----------+--------------+  Dist upper arm       0.30                               +-----------------+-------------+----------+--------------+  Antecubital fossa    0.17                               +-----------------+-------------+----------+--------------+  Prox forearm                            not visualized  +-----------------+-------------+----------+--------------+  Mid forearm                             not visualized  +-----------------+-------------+----------+--------------+  Distal forearm                          not visualized  +-----------------+-------------+----------+--------------+   +-----------------+-------------+----------+--------------+  Left Cephalic    Diameter (cm)Depth (cm)   Findings     +-----------------+-------------+----------+--------------+  Shoulder                               not visualized  +-----------------+-------------+----------+--------------+  Prox upper arm       0.26                               +-----------------+-------------+----------+--------------+  Mid upper arm        0.27                               +-----------------+-------------+----------+--------------+  Dist upper arm       0.28                               +-----------------+-------------+----------+--------------+  Antecubital fossa    0.35                               +-----------------+-------------+----------+--------------+  Prox forearm         0.18                                +-----------------+-------------+----------+--------------+  Mid forearm          0.15                               +-----------------+-------------+----------+--------------+  Dist forearm         0.09                               +-----------------+-------------+----------+--------------+   +-----------------+-------------+----------+--------+  Left Basilic     Diameter (cm)Depth (cm)Findings  +-----------------+-------------+----------+--------+  Prox upper arm       0.49                         +-----------------+-------------+----------+--------+  Mid upper arm        0.59                         +-----------------+-------------+----------+--------+  Dist upper arm       0.38                         +-----------------+-------------+----------+--------+  Antecubital fossa    0.10                         +-----------------+-------------+----------+--------+  Prox forearm         0.07                         +-----------------+-------------+----------+--------+  Mid forearm          0.07                         +-----------------+-------------+----------+--------+  Distal forearm       0.03                               Assessment/Plan:     [name] [name] is a 68 y.o. male with CKD stage IV who presents to discuss permanent access creation.  Dominant hand: right Previous access surgeries: None Previous catheters: None  He explained that he is adamantly against dialysis and has been frustrated since he has been trying to get an appointment with a transplant center and has been referred by his nephrologist but has not had any phone calls returned.  I explained that even if he does get on the transplant list he is very unlikely to receive a transplant prior to becoming end-stage and needing dialysis.  I explained that when this occurs, without dialysis would be likely to die within a few weeks.  We then had an extensive discussion  of what dialysis entails and the typical schedules.  He seems like he would be a candidate for home dialysis although I explained multiple times that his schedule and regiment is decided by his nephrologist.  He has an appointment with his nephrologist the Wednesday before Thanksgiving and I encouraged him to have a detailed discussion at that time.  Given that he may decide to proceed with HD we discussed what his access would entail.  The vein mapping suggests there is a suitable left cephalic and we discussed that he is a candidate for AVF  The risks an benefits including of access creation were reviewed including: need for additional procedures, need for additional creations, steal, ischemia monomelic neuropathy, failure of access, and bleeding. The patient expressed understand and is willing to proceed.  I explained that I will perform intraoperative vein mapping to confirm the above findings and will determine the most appropriate access to create but with a preoperative plan for left arm aVF.  After his discussion with his nephrologist I encouraged him to call to set up a date for left arm access creation.     Norman GORMAN Serve MD [facility]

## 2023-12-23 ENCOUNTER — Ambulatory Visit (HOSPITAL_COMMUNITY)
Admission: RE | Admit: 2023-12-23 | Discharge: 2023-12-23 | Disposition: A | Discharge status: Home / Self Care | Source: Ambulatory Visit | Attending: Vascular Surgery | Admitting: Vascular Surgery

## 2023-12-23 ENCOUNTER — Encounter: Payer: Self-pay | Admitting: Vascular Surgery

## 2023-12-23 ENCOUNTER — Ambulatory Visit (INDEPENDENT_AMBULATORY_CARE_PROVIDER_SITE_OTHER): Admitting: Vascular Surgery

## 2023-12-23 VITALS — BP 224/111 | HR 82 | Temp 98.2°F | Ht 72.0 in | Wt 196.0 lb

## 2023-12-23 DIAGNOSIS — N186 End stage renal disease: Secondary | ICD-10-CM | POA: Diagnosis present

## 2023-12-23 DIAGNOSIS — N184 Chronic kidney disease, stage 4 (severe): Secondary | ICD-10-CM

## 2024-01-22 NOTE — Progress Notes (Signed)
 ABO ordered for lab draw after transplant education class per transplant protocol.

## 2024-01-23 NOTE — Progress Notes (Signed)
 Echo, PFT and ordered at Referral by [facility] 11.14.25 Vein Mapping @ [facility]. HM UTD   Pt attended Kidney  transplant education class with Wife, Clayborn Milnes today. Pre-coordinator role introduced, stressing importance of keeping contact information UTD. Pt and/or caregiver(s) indicated understanding of topics covered, including:required paperwork, transplant evaluation process/required testing, keeping health maintenance up to date, caregiver role/support network, lifelong commitment to a healthy lifestyle & medications after transplant, financial responsibilities, and potential living donors (teaching categories continued below). Exclusion criteria from becoming a candidate for transplant explained. Most recent SRTR results reviewed and provided.  Patient race (UNOS): White: Origin not reported Karnofsky score: 90% - Able to Carry on Normal Activity: Minor Symptoms of Disease Patient ethnicity: Not Hispanic, Latino/a, or Spanish origin  Caregiver: Wife, Diante Barley Potential donors: Yes Patient Denies receiving the Hep B series   HBV Vaccine recommended Flu: due Pneumovax: UTD COVID & brand: UTD Dental: UTD, Form in media. Colonoscopy: 10.10.16, 10 year recall. Report in media.     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34) Dialysis: NOD Nephrologist: Dennise  Creatinine, Ser 0.61 - 1.24 mg/dL 6.02 High   GFR, Estimated >60 mL/min 15 Low     Other Centers: none  Outside Studies: records in [facility] - 07.24.24 MR Prostate [facility]  Hx:  Patient Active Problem List   Diagnosis Date Noted  Date Diagnosed   Barrett's esophagus 04/23/2021    Diabetes mellitus (CMD) 04/23/2021    Diabetic foot infection    (CMD) 04/23/2021    Hypertension 04/23/2021    Stage 3b chronic kidney disease (CKD) (CMD) 04/23/2021    Barrett's esophagus 04/23/2021    Diabetic foot infection    (CMD) 04/23/2021    Hypertension 04/23/2021    Stage 3b chronic kidney disease (CKD) (CMD)  04/23/2021    Diabetic peripheral neuropathy associated with type 2 diabetes mellitus    (CMD) 02/18/2021    Pain in right foot 01/14/2021    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 HM UTD    Teaching categories: Verify demographic information/alternate phone numbers. Explain the role of the members of the transplant team. Benefits of living donors. Average waiting time on list. Options of multiple listing, transferring primary waiting time, and the option to transfer their care to a different transplant center without loss of accrued waiting time, during the evaluation process. Required Health Maintenance Patient responsibilities while on waiting list - notifying us  of change of address, phone numbers or insurance. Call-in procedure. Backup call. Length of stay/ICU/incision/catheter/central line/possible drains. lmmunosuppressive drug therapy Common side effects of immunosuppressive therapy (weight gain, skin cancer) Prophylactic drug therapy/medication Potential post-transplant complications (infection, rejecti on, cancer). Encourage to always carry current medications to all doctor's appointments. Follow up clinic visits/after 9 months-1 year, back to referring physician. Frequent communication regarding care, labs, medication dose changes, etc. Patient responsibilities after transplant - taking medications as ordered,  keeping clinic appointments, and  notifying us  of potential complications. Business cards. Explain [place] [employer] for smoking cessation referral form.

## 2024-01-23 NOTE — Progress Notes (Signed)
" °   01/23/24 1000  Weakness (Grip Strength)  Weakeness: Grip Strength 1 33.1  Weakeness: Grip Strength 2 32.7  Weakeness: Grip Strength 3 33  Weakness (Grip Strength) Score 32.93  Manual Grip BMI Score <30kg (high BMI 26.1-28)  Manual Grip Score 1  Chair Stands  Manual Total Chair Stand Score 4 (11)  Balance Testing  Manual Total Balance Testing Score 4  15 Foot Walk  Manual Total 15 Foot Walk Testing Score 4 (3.25/3.18)  OTHER  Total Fitness Score 13    FIT TESTING New Eval  GENERAL HEALTH:  In general, would you say your health is:    Very Good  Compared to one year ago, how would you rate your health in general now?   same   LIMITATIONS OF ACTIVITIES:    Vigorous activities, such as running, lifting heavy objects, participating in strenuous sports.   2- Yes, Limited a lot  Moderate activities, such as moving a table, pushing a vacuum cleaner, bowling, or playing golf 0- No, Not limited  Lifting or carrying groceries 0- No, Not limited  Climbing several flights of stairs 1- Yes, Limited a little  Climbing one flight of stairs 0- No, Not limited  Bending, kneeling, or stooping 0- No, Not limited  Walking more than a mile 1- Yes, Limited a little  Walking several blocks 0- No, Not limited  Walking one block 0- No, Not limited  Bathing or dressing yourself 0- No, Not limited   SELF REPORT LIMITATIONS OF ACTIVITIES SCORE: 4    2 pts yes, limited a lot; 1 pt yes, limited a little; 0 pts No, not limited at all  <8 FIT, >/=8-14 intermediate, 15-20 Not fit   Perform short physical performance testing in the following conditions (charted below if applicable):  Limitations score >/= 8 Age >/= 3 Assistive device present during exam Appearance of frailty despite above criteria   Do you use oxygen at home or at dialysis?  No  Do you ever have trouble with blood pressure that is too low?  No  Do you own any of the following (check all that apply)   Cane  How often do you use the motorized carts available to pick up your groceries?  Never  Have you lost more than 10 lbs without trying to in the last 12 months?  If so, how much?   No  Do you need any assistance performing any of the following activities of daily living?  None of the Above  Do you use a pillbox?   No   Who fills your pillbox?  No pillbox  Have you had any falls?  Never/None in the last year  "

## 2024-02-01 NOTE — Telephone Encounter (Signed)
 Left a detailed VM with appt dates/times.
